# Patient Record
Sex: Male | Born: 1990 | Race: White | Hispanic: No | Marital: Married | Smoking: Never smoker
Health system: Southern US, Community
[De-identification: ages and names within clinical notes are randomized; demographics above are authoritative.]

## PROBLEM LIST (undated history)

## (undated) DIAGNOSIS — I10 Essential (primary) hypertension: Secondary | ICD-10-CM

## (undated) DIAGNOSIS — M549 Dorsalgia, unspecified: Secondary | ICD-10-CM

## (undated) DIAGNOSIS — G43909 Migraine, unspecified, not intractable, without status migrainosus: Secondary | ICD-10-CM

## (undated) HISTORY — PX: VASECTOMY: SHX75

## (undated) HISTORY — PX: NO PAST SURGERIES: SHX2092

---

## 2017-10-24 ENCOUNTER — Ambulatory Visit
Admission: EM | Admit: 2017-10-24 | Discharge: 2017-10-24 | Disposition: A | Discharge status: Home / Self Care | Payer: Managed Care, Other (non HMO) | Attending: Family Medicine | Admitting: Family Medicine

## 2017-10-24 ENCOUNTER — Other Ambulatory Visit: Payer: Self-pay

## 2017-10-24 DIAGNOSIS — A084 Viral intestinal infection, unspecified: Secondary | ICD-10-CM

## 2017-10-24 MED ORDER — ONDANSETRON 4 MG PO TBDP: 4.0000 mg | ORAL_TABLET | Freq: Three times a day (TID) | ORAL | 0 refills | Status: DC | PRN

## 2017-10-24 MED ORDER — PROMETHAZINE HCL 25 MG PO TABS: 25.0000 mg | ORAL_TABLET | Freq: Four times a day (QID) | ORAL | 0 refills | Status: DC | PRN

## 2017-10-24 MED ORDER — ONDANSETRON 8 MG PO TBDP
8.0000 mg | ORAL_TABLET | Freq: Once | ORAL | Status: AC
  Administered 2017-10-24: 8 mg via ORAL

## 2017-10-24 NOTE — ED Provider Notes (Signed)
[facility]  CSN: [id number] Arrival date & time: 10/24/17  1101  History   Chief Complaint Chief Complaint  Patient presents with  . Emesis   HPI  27 year old male presents with nausea, vomiting, diarrhea.  Started yesterday.  His wife has recently had the same illness.  He states that his wife got it from a coworker.  He reports ongoing nausea, vomiting, diarrhea.  Last episode of vomiting and diarrhea was early this morning.  He has been able to tolerate fluids and some crackers since this morning.  Feels poorly.  Associated fatigue.  No fever.  Diffuse abdominal pain.  Symptoms are moderate to severe.  No known exacerbating factors.  No other associated symptoms.  No other complaints.  History reviewed. No pertinent past medical history.  Past Surgical History:  Procedure Laterality Date  . NO PAST SURGERIES     Home Medications    Prior to Admission medications   Medication Sig Start Date End Date Taking? Authorizing Provider  ondansetron (ZOFRAN-ODT) 4 MG disintegrating tablet Take 1 tablet (4 mg total) by mouth every 8 (eight) hours as needed for nausea or vomiting. 10/24/17   Tommie Sams, DO  promethazine (PHENERGAN) 25 MG tablet Take 1 tablet (25 mg total) by mouth every 6 (six) hours as needed for nausea or vomiting. 10/24/17   Tommie Sams, DO    Family History Family History  Problem Relation Age of Onset  . Healthy Mother   . Healthy Father     Social History Social History   Tobacco Use  . Smoking status: Never Smoker  . Smokeless tobacco: Never Used  Substance Use Topics  . Alcohol use: Never    Frequency: Never  . Drug use: Never     Allergies   Patient has no known allergies.   Review of Systems Review of Systems  Constitutional: Positive for fatigue. Negative for fever.  Gastrointestinal: Positive for diarrhea, nausea and vomiting.   Physical Exam Triage Vital Signs ED Triage Vitals  Enc Vitals Group     BP 10/24/17 1125  135/71     Pulse Rate 10/24/17 1125 63     Resp 10/24/17 1125 18     Temp 10/24/17 1125 98.5 F (36.9 C)     Temp Source 10/24/17 1125 Oral     SpO2 10/24/17 1125 100 %     Weight 10/24/17 1126 210 lb (95.3 kg)     Height 10/24/17 1126 5\' 9"  (1.753 m)     Head Circumference --      Peak Flow --      Pain Score 10/24/17 1126 6     Pain Loc --      Pain Edu? --      Excl. in GC? --    Updated Vital Signs BP 135/71 (BP Location: Left Arm)   Pulse 63   Temp 98.5 F (36.9 C) (Oral)   Resp 18   Ht 5\' 9"  (1.753 m)   Wt 95.3 kg   SpO2 100%   BMI 31.01 kg/m   Visual Acuity Right Eye Distance:   Left Eye Distance:   Bilateral Distance:    Right Eye Near:   Left Eye Near:    Bilateral Near:     Physical Exam  Constitutional: He is oriented to person, place, and time. He appears well-developed. No distress.  In no acute distress but appears mildly ill & fatigued.  HENT:  Head: Normocephalic and atraumatic.  Mouth/Throat: Oropharynx is  clear and moist.  Cardiovascular: Normal rate and regular rhythm.  Pulmonary/Chest: Effort normal and breath sounds normal.  Abdominal: Soft. He exhibits no distension.  Diffusely tender to palpation.  Neurological: He is alert and oriented to person, place, and time.  Psychiatric: He has a normal mood and affect. His behavior is normal.  Nursing note and vitals reviewed.  UC Treatments / Results  Labs (all labs ordered are listed, but only abnormal results are displayed) Labs Reviewed - No data to display  EKG None  Radiology No results found.  Procedures Procedures (including critical care time)  Medications Ordered in UC Medications  ondansetron (ZOFRAN-ODT) disintegrating tablet 8 mg (8 mg Oral Given 10/24/17 1141)    Initial Impression / Assessment and Plan / UC Course  I have reviewed the triage vital signs and the nursing notes.  Pertinent labs & imaging results that were available during my care of the patient were  reviewed by me and considered in my medical decision making (see chart for details).    27 year old male presents with viral gastroenteritis.  Treating with Zofran and Phenergan (if needed).  Advised aggressive hydration.  Work note given.  Final Clinical Impressions(s) / UC Diagnoses   Final diagnoses:  Viral gastroenteritis     Discharge Instructions     Rest.  Lots of fluids.  Take care  Dr. Adriana Simasook    ED Prescriptions    Medication Sig Dispense Auth. Provider   ondansetron (ZOFRAN-ODT) 4 MG disintegrating tablet Take 1 tablet (4 mg total) by mouth every 8 (eight) hours as needed for nausea or vomiting. 20 tablet Yanai Hobson G, DO   promethazine (PHENERGAN) 25 MG tablet Take 1 tablet (25 mg total) by mouth every 6 (six) hours as needed for nausea or vomiting. 30 tablet Tommie Samsook, Brent Taillon G, DO     Controlled Substance Prescriptions [place] Controlled Substance Registry consulted? Not Applicable   Tommie SamsCook, Ebany Bowermaster G, DO 10/24/17 1206

## 2017-10-24 NOTE — Discharge Instructions (Signed)
Rest.  ° °Lots of fluids. ° °Take care ° °Dr. Jaishawn Witzke  °

## 2017-10-24 NOTE — ED Triage Notes (Signed)
Patient complains of nausea, vomiting, diarrhea x yesterday. Last Episode this am.

## 2018-05-24 ENCOUNTER — Emergency Department
Admission: EM | Admit: 2018-05-24 | Discharge: 2018-05-25 | Disposition: A | Discharge status: Home / Self Care | Payer: Managed Care, Other (non HMO) | Attending: Emergency Medicine | Admitting: Emergency Medicine

## 2018-05-24 ENCOUNTER — Encounter: Payer: Self-pay | Admitting: *Deleted

## 2018-05-24 ENCOUNTER — Emergency Department: Payer: Managed Care, Other (non HMO)

## 2018-05-24 ENCOUNTER — Other Ambulatory Visit: Payer: Self-pay

## 2018-05-24 DIAGNOSIS — R070 Pain in throat: Secondary | ICD-10-CM | POA: Diagnosis present

## 2018-05-24 DIAGNOSIS — R509 Fever, unspecified: Secondary | ICD-10-CM

## 2018-05-24 DIAGNOSIS — Z20828 Contact with and (suspected) exposure to other viral communicable diseases: Secondary | ICD-10-CM | POA: Insufficient documentation

## 2018-05-24 DIAGNOSIS — J069 Acute upper respiratory infection, unspecified: Secondary | ICD-10-CM | POA: Insufficient documentation

## 2018-05-24 DIAGNOSIS — R6889 Other general symptoms and signs: Secondary | ICD-10-CM

## 2018-05-24 DIAGNOSIS — Z20822 Contact with and (suspected) exposure to covid-19: Secondary | ICD-10-CM

## 2018-05-24 HISTORY — DX: Dorsalgia, unspecified: M54.9

## 2018-05-24 LAB — CBC WITH DIFFERENTIAL/PLATELET
Abs Immature Granulocytes: 0.04 10*3/uL (ref 0.00–0.07)
BASOS ABS: 0.1 10*3/uL (ref 0.0–0.1)
BASOS PCT: 0 %
EOS ABS: 0 10*3/uL (ref 0.0–0.5)
EOS PCT: 0 %
HCT: 42.1 % (ref 39.0–52.0)
Hemoglobin: 14.4 g/dL (ref 13.0–17.0)
Immature Granulocytes: 0 %
Lymphocytes Relative: 11 %
Lymphs Abs: 1.6 10*3/uL (ref 0.7–4.0)
MCH: 29 pg (ref 26.0–34.0)
MCHC: 34.2 g/dL (ref 30.0–36.0)
MCV: 84.9 fL (ref 80.0–100.0)
Monocytes Absolute: 1 10*3/uL (ref 0.1–1.0)
Monocytes Relative: 7 %
NRBC: 0 % (ref 0.0–0.2)
Neutro Abs: 11.5 10*3/uL — ABNORMAL HIGH (ref 1.7–7.7)
Neutrophils Relative %: 82 %
PLATELETS: 279 10*3/uL (ref 150–400)
RBC: 4.96 MIL/uL (ref 4.22–5.81)
RDW: 12.6 % (ref 11.5–15.5)
WBC: 14.2 10*3/uL — AB (ref 4.0–10.5)

## 2018-05-24 LAB — GROUP A STREP BY PCR: Group A Strep by PCR: NOT DETECTED

## 2018-05-24 LAB — INFLUENZA PANEL BY PCR (TYPE A & B)
INFLBPCR: NEGATIVE
Influenza A By PCR: NEGATIVE

## 2018-05-24 MED ORDER — SODIUM CHLORIDE 0.9 % IV BOLUS
1000.0000 mL | Freq: Once | INTRAVENOUS | Status: AC
  Administered 2018-05-24: 1000 mL via INTRAVENOUS

## 2018-05-24 MED ORDER — KETOROLAC TROMETHAMINE 30 MG/ML IJ SOLN
30.0000 mg | Freq: Once | INTRAMUSCULAR | Status: AC
  Administered 2018-05-24: 30 mg via INTRAVENOUS
  Filled 2018-05-24: qty 1

## 2018-05-24 MED ORDER — ACETAMINOPHEN 500 MG PO TABS
1000.0000 mg | ORAL_TABLET | ORAL | Status: AC
  Administered 2018-05-24: 1000 mg via ORAL
  Filled 2018-05-24: qty 2

## 2018-05-24 NOTE — ED Triage Notes (Signed)
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 Patient has a fever, c/o body aches and a sore throat which started yesterday morning. Patient has not received the flu vaccine.

## 2018-05-24 NOTE — ED Notes (Signed)
Report given to Noel RN 

## 2018-05-25 LAB — BASIC METABOLIC PANEL
Anion gap: 10 (ref 5–15)
BUN: 19 mg/dL (ref 6–20)
CALCIUM: 9.2 mg/dL (ref 8.9–10.3)
CO2: 21 mmol/L — ABNORMAL LOW (ref 22–32)
CREATININE: 1.17 mg/dL (ref 0.61–1.24)
Chloride: 108 mmol/L (ref 98–111)
GFR calc non Af Amer: >60 mL/min (ref >60)
Glucose, Bld: 111 mg/dL — ABNORMAL HIGH (ref 70–99)
Potassium: 3.7 mmol/L (ref 3.5–5.1)
SODIUM: 139 mmol/L (ref 135–145)

## 2018-05-25 LAB — MONONUCLEOSIS SCREEN: MONO SCREEN: NEGATIVE

## 2018-05-25 MED ORDER — SODIUM CHLORIDE 0.9 % IV BOLUS
1000.0000 mL | Freq: Once | INTRAVENOUS | Status: AC
  Administered 2018-05-25: 1000 mL via INTRAVENOUS

## 2018-05-25 NOTE — ED Notes (Signed)
Peripheral IV discontinued. Catheter intact. No signs of infiltration or redness. Gauze applied to IV site.   Discharge instructions reviewed with patient. Questions fielded by this RN. Patient verbalizes understanding of instructions. Patient discharged home in stable condition per sung. No acute distress noted at time of discharge.    

## 2018-05-25 NOTE — ED Provider Notes (Signed)
[facility] Emergency Department Provider Note   ____________________________________________   First MD Initiated Contact with Patient 05/24/18 2231     (approximate)  I have reviewed the triage vital signs and the nursing notes.   HISTORY  Chief Complaint Fever    HPI Devin Carpenter is a 28 y.o. male reports no major medical history other than chronic back pain  Patient reports he started feeling yesterday a sore throat and body aches.  His symptoms have worsened over the course last 24 hours.  He reports that dry cough, body aches, fever.  Slightly sore throat.  No nausea vomiting or diarrhea.  Reports started having fevers and significant chills.  No recent hospitalizations or history of infections.  He works at the [employer] where there are many cases of coronavirus now in [facility].  He also reports multiple coworkers of his went to a concert where coronavirus was suspected to be spread   Past Medical History:  Diagnosis Date  . Back pain     There are no active problems to display for this patient.   Past Surgical History:  Procedure Laterality Date  . NO PAST SURGERIES      Prior to Admission medications   Medication Sig Start Date End Date Taking? Authorizing Provider  ondansetron (ZOFRAN-ODT) 4 MG disintegrating tablet Take 1 tablet (4 mg total) by mouth every 8 (eight) hours as needed for nausea or vomiting. 10/24/17   Tommie Sams, DO  promethazine (PHENERGAN) 25 MG tablet Take 1 tablet (25 mg total) by mouth every 6 (six) hours as needed for nausea or vomiting. 10/24/17   Tommie Sams, DO    Allergies Patient has no known allergies.  Family History  Problem Relation Age of Onset  . Healthy Mother   . Healthy Father     Social History Social History   Tobacco Use  . Smoking status: Never Smoker  . Smokeless tobacco: Never Used  Substance Use Topics  . Alcohol use: Never    Frequency: Never  . Drug use:  Never    Review of Systems Constitutional: Fevers chills and some fatigue Eyes: No visual changes. ENT: Moderate sore throat.  No trouble swallowing.  Uvula midline.  No masses. Cardiovascular: Denies chest pain. Respiratory: Denies shortness of breath.  Positive for productive cough. Gastrointestinal: No abdominal pain.   Genitourinary: Negative for dysuria. Musculoskeletal: Negative for back pain except for his chronic pain, no worsening.  Muscle aches all over. Skin: Negative for rash. Neurological: Negative for headaches, areas of focal weakness or numbness.    ____________________________________________   PHYSICAL EXAM:  VITAL SIGNS: ED Triage Vitals  Enc Vitals Group     BP 05/24/18 2210 139/81     Pulse Rate 05/24/18 2210 99     Resp 05/24/18 2210 (!) 22     Temp 05/24/18 2210 (!) 100.5 F (38.1 C)     Temp Source 05/24/18 2210 Oral     SpO2 05/24/18 2210 100 %     Weight 05/24/18 2216 215 lb (97.5 kg)     Height 05/24/18 2216 5\' 9"  (1.753 m)     Head Circumference --      Peak Flow --      Pain Score 05/24/18 2215 9     Pain Loc --      Pain Edu? --      Excl. in GC? --     Constitutional: Alert and oriented.  Mildly ill-appearing.  Very pleasant.  No acute distress Eyes: Conjunctivae are normal. Head: Atraumatic. Nose: No congestion/rhinnorhea. Mouth/Throat: Mucous membranes are moist. Neck: No stridor.  Posterior oropharynx is slightly injected.  Tonsils are slightly hypertrophied without exudate bilateral.  No cervical adenopathy. Cardiovascular: This minimally tachycardic rate, regular rhythm. Grossly normal heart sounds.  Good peripheral circulation. Respiratory: Normal respiratory effort.  No retractions. Lungs CTAB. Gastrointestinal: Soft and nontender. No distention. Musculoskeletal: No lower extremity tenderness nor edema. Neurologic:  Normal speech and language. No gross focal neurologic deficits are appreciated.  Skin:  Skin is warm, dry and  intact. No rash noted. Psychiatric: Mood and affect are normal. Speech and behavior are normal.  ____________________________________________   LABS (all labs ordered are listed, but only abnormal results are displayed)  Labs Reviewed  CBC WITH DIFFERENTIAL/PLATELET - Abnormal; Notable for the following components:      Result Value   WBC 14.2 (*)    Neutro Abs 11.5 (*)    All other components within normal limits  BASIC METABOLIC PANEL - Abnormal; Notable for the following components:   CO2 21 (*)    Glucose, Bld 111 (*)    All other components within normal limits  GROUP A STREP BY PCR  NOVEL CORONAVIRUS, NAA (HOSPITAL ORDER, SEND-OUT TO REF LAB)  INFLUENZA PANEL BY PCR (TYPE A & B)  MONONUCLEOSIS SCREEN   ____________________________________________  EKG   ____________________________________________  RADIOLOGY  Chest x-ray clear ____________________________________________   PROCEDURES  Procedure(s) performed: None  Procedures  Critical Care performed: No  ____________________________________________   INITIAL IMPRESSION / ASSESSMENT AND PLAN / ED COURSE  Pertinent labs & imaging results that were available during my care of the patient were reviewed by me and considered in my medical decision making (see chart for details).   Patient presents with constellations of findings suggestive of viral illness.  I will certainly test him and exclude influenza, obtain chest x-ray to evaluate for possible pulmonary associated disease.  Denies symptoms of abdominal pain or urinary symptoms.  No rash.  Unfortunately he has multiple close contacts that could have coronavirus though it is unclear.  Also check mono and strep.  Hydrate provide Toradol for antipyretic and myalgias.  ----------------------------------------- 12:43 AM on 05/25/2018 -----------------------------------------  Patient reports he is starting to feel improved.  He is resting comfortably with  normal oxygen saturations and work of breathing.  He has chest slight tachycardia with a heart rate of about 100.  Will give additional liter of fluid, at this point he is not having any respiratory distress, his oxygen saturations well he has normal level of alertness, young healthy without notable comorbidity.  I suspect this may be coronavirus, or likely other viral syndrome.  Doubt bacterial.  Ongoing care assigned to Dr. Wynelle Link, follow-up conversation with the patient after fluid and if feeling improved, heart rate improving anticipate discharge to home.  I did discuss coronavirus test, exclusion of coronavirus and self-monitoring with the patient and he is in agreement with that.  Completed forms for public health as well.      ____________________________________________   FINAL CLINICAL IMPRESSION(S) / ED DIAGNOSES  Final diagnoses:  Suspected Covid-19 Virus Infection  Viral upper respiratory tract infection        Note:  This document was prepared using Dragon voice recognition software and may include unintentional dictation errors       Sharyn Creamer, MD 05/25/18 0045

## 2018-05-25 NOTE — Discharge Instructions (Addendum)
Take Tylenol every 4 hours as needed for fever greater than  100.4 F.  We do not recommend you taking ibuprofen as we suspect you have coronavirus.  As we discussed, we believe your symptoms are caused by a respiratory virus.  However, because we cannot rule out the possibility of COVID-19 at this time, we recommend that you self-quarantine at home for 14 days, or until 3 consecutive days without fever (without taking medication to make your temperature come down, such as Tylenol (acetaminophen), after your respiratory symptoms have improved, and after at least 7 days have passed since your symptoms first appeared.  You should have as minimal contact as possible with anyone else including close family as per the [facility] paperwork guidelines listed below. Follow-up with your doctor by phone or online as needed and return immediately to the emergency department or call 911 only if you develop new or worsening symptoms that concern you.     Person Under Monitoring Name: Devin Carpenter  Location: [address] [place] [address]   Infection Prevention Recommendations for Individuals Confirmed to have, or Being Evaluated for, 2019 Novel Coronavirus (COVID-19) Infection Who Receive Care at Home  Individuals who are confirmed to have, or are being evaluated for, COVID-19 should follow the prevention steps below until a healthcare provider or local or state health department says they can return to normal activities.  Stay home except to get medical care You should restrict activities outside your home, except for getting medical care. Do not go to work, school, or public areas, and do not use public transportation or taxis.  Call ahead before visiting your doctor Before your medical appointment, call the healthcare provider and tell them that you have, or are being evaluated for, COVID-19 infection. This will help the healthcare providers office take steps to keep other people from getting  infected. Ask your healthcare provider to call the local or state health department.  Monitor your symptoms Seek prompt medical attention if your illness is worsening (e.g., difficulty breathing). Before going to your medical appointment, call the healthcare provider and tell them that you have, or are being evaluated for, COVID-19 infection. Ask your healthcare provider to call the local or state health department.  Wear a facemask You should wear a facemask that covers your nose and mouth when you are in the same room with other people and when you visit a healthcare provider. People who live with or visit you should also wear a facemask while they are in the same room with you.  Separate yourself from other people in your home As much as possible, you should stay in a different room from other people in your home. Also, you should use a separate bathroom, if available.  Avoid sharing household items You should not share dishes, drinking glasses, cups, eating utensils, towels, bedding, or other items with other people in your home. After using these items, you should wash them thoroughly with soap and water.  Cover your coughs and sneezes Cover your mouth and nose with a tissue when you cough or sneeze, or you can cough or sneeze into your sleeve. Throw used tissues in a lined trash can, and immediately wash your hands with soap and water for at least 20 seconds or use an alcohol-based hand rub.  Wash your [employer] your hands often and thoroughly with soap and water for at least 20 seconds. You can use an alcohol-based hand sanitizer if soap and water are not available and  if your hands are not visibly dirty. Avoid touching your eyes, nose, and mouth with unwashed hands.   Prevention Steps for Caregivers and Household Members of Individuals Confirmed to have, or Being Evaluated for, COVID-19 Infection Being Cared for in the Home  If you live with, or provide care at home for, a  person confirmed to have, or being evaluated for, COVID-19 infection please follow these guidelines to prevent infection:  Follow healthcare providers instructions Make sure that you understand and can help the patient follow any healthcare provider instructions for all care.  Provide for the patients basic needs You should help the patient with basic needs in the home and provide support for getting groceries, prescriptions, and other personal needs.  Monitor the patients symptoms If they are getting sicker, call his or her medical provider and tell them that the patient has, or is being evaluated for, COVID-19 infection. This will help the healthcare providers office take steps to keep other people from getting infected. Ask the healthcare provider to call the local or state health department.  Limit the number of people who have contact with the patient If possible, have only one caregiver for the patient. Other household members should stay in another home or place of residence. If this is not possible, they should stay in another room, or be separated from the patient as much as possible. Use a separate bathroom, if available. Restrict visitors who do not have an essential need to be in the home.  Keep older adults, very young children, and other sick people away from the patient Keep older adults, very young children, and those who have compromised immune systems or chronic health conditions away from the patient. This includes people with chronic heart, lung, or kidney conditions, diabetes, and cancer.  Ensure good ventilation Make sure that shared spaces in the home have good air flow, such as from an air conditioner or an opened window, weather permitting.  Wash your hands often Wash your hands often and thoroughly with soap and water for at least 20 seconds. You can use an alcohol based hand sanitizer if soap and water are not available and if your hands are not visibly  dirty. Avoid touching your eyes, nose, and mouth with unwashed hands. Use disposable paper towels to dry your hands. If not available, use dedicated cloth towels and replace them when they become wet.  Wear a facemask and gloves Wear a disposable facemask at all times in the room and gloves when you touch or have contact with the patients blood, body fluids, and/or secretions or excretions, such as sweat, saliva, sputum, nasal mucus, vomit, urine, or feces.  Ensure the mask fits over your nose and mouth tightly, and do not touch it during use. Throw out disposable facemasks and gloves after using them. Do not reuse. Wash your hands immediately after removing your facemask and gloves. If your personal clothing becomes contaminated, carefully remove clothing and launder. Wash your hands after handling contaminated clothing. Place all used disposable facemasks, gloves, and other waste in a lined container before disposing them with other household waste. Remove gloves and wash your hands immediately after handling these items.  Do not share dishes, glasses, or other household items with the patient Avoid sharing household items. You should not share dishes, drinking glasses, cups, eating utensils, towels, bedding, or other items with a patient who is confirmed to have, or being evaluated for, COVID-19 infection. After the person uses these items, you should wash them  thoroughly with soap and water.  Wash laundry thoroughly Immediately remove and wash clothes or bedding that have blood, body fluids, and/or secretions or excretions, such as sweat, saliva, sputum, nasal mucus, vomit, urine, or feces, on them. Wear gloves when handling laundry from the patient. Read and follow directions on labels of laundry or clothing items and detergent. In general, wash and dry with the warmest temperatures recommended on the label.  Clean all areas the individual has used often Clean all touchable surfaces, such  as counters, tabletops, doorknobs, bathroom fixtures, toilets, phones, keyboards, tablets, and bedside tables, every day. Also, clean any surfaces that may have blood, body fluids, and/or secretions or excretions on them. Wear gloves when cleaning surfaces the patient has come in contact with. Use a diluted bleach solution (e.g., dilute bleach with 1 part bleach and 10 parts water) or a household disinfectant with a label that says EPA-registered for coronaviruses. To make a bleach solution at home, add 1 tablespoon of bleach to 1 quart (4 cups) of water. For a larger supply, add  cup of bleach to 1 gallon (16 cups) of water. Read labels of cleaning products and follow recommendations provided on product labels. Labels contain instructions for safe and effective use of the cleaning product including precautions you should take when applying the product, such as wearing gloves or eye protection and making sure you have good ventilation during use of the product. Remove gloves and wash hands immediately after cleaning.  Monitor yourself for signs and symptoms of illness Caregivers and household members are considered close contacts, should monitor their health, and will be asked to limit movement outside of the home to the extent possible. Follow the monitoring steps for close contacts listed on the symptom monitoring form.   ? If you have additional questions, contact your local health department or call the epidemiologist on call at [phone] (available 24/7). ? This guidance is subject to change. For the most up-to-date guidance from [facility], please refer to their website: [email or web address]

## 2018-05-31 LAB — NOVEL CORONAVIRUS, NAA (HOSPITAL ORDER, SEND-OUT TO REF LAB): SARS-COV-2, NAA: NOT DETECTED

## 2018-06-01 ENCOUNTER — Encounter: Payer: Self-pay | Admitting: Emergency Medicine

## 2018-06-01 ENCOUNTER — Other Ambulatory Visit: Payer: Self-pay

## 2018-06-01 ENCOUNTER — Emergency Department
Admission: EM | Admit: 2018-06-01 | Discharge: 2018-06-01 | Disposition: A | Discharge status: Home / Self Care | Payer: Managed Care, Other (non HMO) | Attending: Emergency Medicine | Admitting: Emergency Medicine

## 2018-06-01 DIAGNOSIS — Z0289 Encounter for other administrative examinations: Secondary | ICD-10-CM | POA: Diagnosis present

## 2018-06-01 DIAGNOSIS — Z7689 Persons encountering health services in other specified circumstances: Secondary | ICD-10-CM

## 2018-06-01 NOTE — ED Triage Notes (Signed)
Patient reports he was recently tested for coronavirus after having cold symptoms.  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 Patient's results came back negative but patient states he is still not allowed to return to work until cleared by doctor. States he was not able to get appt with PCP.

## 2018-06-01 NOTE — ED Notes (Signed)
Patient reports "I am here for a work note" Relayed he was tested for corona and results came back negative. He now needs work note saying he is clear to work and PCP wouldn't see him to give him note

## 2018-06-01 NOTE — ED Provider Notes (Signed)
[facility] Emergency Department Provider Note  ____________________________________________   First MD Initiated Contact with Patient 06/01/18 1139     (approximate)  I have reviewed the triage vital signs and the nursing notes.   HISTORY  Chief Complaint Medical Clearance    HPI Devin Carpenter is a 28 y.o. male presents emergency department stating that he needs a note to return to work.  He states he was tested for coronavirus and was not allowed to return to work as he works in [employer].  He states he did get his results which are negative and would like to go back to work now.  He is asymptomatic.    Past Medical History:  Diagnosis Date  . Back pain     There are no active problems to display for this patient.   Past Surgical History:  Procedure Laterality Date  . NO PAST SURGERIES      Prior to Admission medications   Not on File    Allergies Patient has no known allergies.  Family History  Problem Relation Age of Onset  . Healthy Mother   . Healthy Father     Social History Social History   Tobacco Use  . Smoking status: Never Smoker  . Smokeless tobacco: Never Used  Substance Use Topics  . Alcohol use: Never    Frequency: Never  . Drug use: Never    Review of Systems  Constitutional: No fever/chills Eyes: No visual changes. ENT: No sore throat. Respiratory: Denies cough Genitourinary: Negative for dysuria. Musculoskeletal: Negative for back pain. Skin: Negative for rash.    ____________________________________________   PHYSICAL EXAM:  VITAL SIGNS: ED Triage Vitals  Enc Vitals Group     BP 06/01/18 1128 130/88     Pulse Rate 06/01/18 1128 68     Resp 06/01/18 1128 18     Temp 06/01/18 1128 98.4 F (36.9 C)     Temp Source 06/01/18 1128 Oral     SpO2 06/01/18 1128 97 %     Weight 06/01/18 1128 215 lb (97.5 kg)     Height 06/01/18 1128 5\' 9"  (1.753 m)     Head Circumference --    Peak Flow --      Pain Score 06/01/18 1131 0     Pain Loc --      Pain Edu? --      Excl. in GC? --     Constitutional: Alert and oriented. Well appearing and in no acute distress. Eyes: Conjunctivae are normal.  Head: Atraumatic. Nose: No congestion/rhinnorhea. Mouth/Throat: Mucous membranes are moist.   Neck:  supple no lymphadenopathy noted Cardiovascular: Normal rate, regular rhythm. Heart sounds are normal Respiratory: Normal respiratory effort.  No retractions, lungs c t a  GU: deferred Musculoskeletal: FROM all extremities, warm and well perfused Neurologic:  Normal speech and language.  Skin:  Skin is warm, dry and intact. No rash noted. Psychiatric: Mood and affect are normal. Speech and behavior are normal.  ____________________________________________   LABS (all labs ordered are listed, but only abnormal results are displayed)  Labs Reviewed - No data to display ____________________________________________   ____________________________________________  RADIOLOGY    ____________________________________________   PROCEDURES  Procedure(s) performed: No  Procedures    ____________________________________________   INITIAL IMPRESSION / ASSESSMENT AND PLAN / ED COURSE  Pertinent labs & imaging results that were available during my care of the patient were reviewed by me and considered in my medical decision making (see  chart for details).   Patient presents emergency department and request of a work note.  Patient appears well.  Due to his coronavirus test being negative I have no concerns as he is asymptomatic.  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     As part of my medical decision making, I reviewed the following data within the electronic MEDICAL RECORD NUMBER Nursing notes reviewed and incorporated, Old chart reviewed, Notes from prior ED visits and [place] Controlled Substance Database   ____________________________________________   FINAL CLINICAL IMPRESSION(S) / ED DIAGNOSES  Final diagnoses:  Return to work evaluation      NEW MEDICATIONS STARTED DURING THIS VISIT:  Discharge Medication List as of 06/01/2018 11:45 AM       Note:  This document was prepared using Dragon voice recognition software and may include unintentional dictation errors.    Faythe Ghee, PA-C 06/01/18 1301    Minna Antis, MD 06/02/18 2121

## 2018-06-01 NOTE — Discharge Instructions (Addendum)
Follow-up with your regular doctor if any concerns.  Due to the fact that you have improved and your coronavirus test was negative you may return to work.

## 2018-11-04 ENCOUNTER — Encounter: Payer: Self-pay | Admitting: Emergency Medicine

## 2018-11-04 ENCOUNTER — Ambulatory Visit
Admission: EM | Admit: 2018-11-04 | Discharge: 2018-11-04 | Disposition: A | Discharge status: Home / Self Care | Payer: Managed Care, Other (non HMO) | Attending: Family Medicine | Admitting: Family Medicine

## 2018-11-04 ENCOUNTER — Other Ambulatory Visit: Payer: Self-pay

## 2018-11-04 DIAGNOSIS — R112 Nausea with vomiting, unspecified: Secondary | ICD-10-CM | POA: Diagnosis not present

## 2018-11-04 MED ORDER — ONDANSETRON 4 MG PO TBDP: 4.0000 mg | ORAL_TABLET | Freq: Three times a day (TID) | ORAL | 0 refills | Status: DC | PRN

## 2018-11-04 MED ORDER — ONDANSETRON 8 MG PO TBDP
8.0000 mg | ORAL_TABLET | Freq: Once | ORAL | Status: AC
  Administered 2018-11-04: 8 mg via ORAL

## 2018-11-04 MED ORDER — PROMETHAZINE HCL 25 MG PO TABS: 25.0000 mg | ORAL_TABLET | Freq: Three times a day (TID) | ORAL | 0 refills | Status: DC | PRN

## 2018-11-04 NOTE — ED Notes (Signed)
Patient taking PO sips of ginger ale.

## 2018-11-04 NOTE — ED Provider Notes (Signed)
[facility]    CSN: [id number] Arrival date & time: 11/04/18  1242  History   Chief Complaint Chief Complaint  Patient presents with  . Emesis   HPI  28 year old male presents with nausea and vomiting.  Patient reports that his symptoms started abruptly this morning.  He reports nausea and one episode of emesis.  Continues to feel very nauseated.  No abdominal pain.  No diarrhea.  He is concerned that he may have food poisoning.  No sick contacts.  No fever.  He is tolerating fluids.  No known exacerbating or relieving factors.  No reports of hematemesis.  No other associated symptoms.  No other complaints.  PMH, Surgical Hx, Family Hx, Social History reviewed and updated as below.  Past Medical History:  Diagnosis Date  . Back pain    Past Surgical History:  Procedure Laterality Date  . NO PAST SURGERIES     Home Medications    Prior to Admission medications   Medication Sig Start Date End Date Taking? Authorizing Provider  ondansetron (ZOFRAN-ODT) 4 MG disintegrating tablet Take 1 tablet (4 mg total) by mouth every 8 (eight) hours as needed for nausea or vomiting. 11/04/18   Tommie Samsook, Taner Rzepka G, DO  promethazine (PHENERGAN) 25 MG tablet Take 1 tablet (25 mg total) by mouth every 8 (eight) hours as needed for nausea, vomiting or refractory nausea / vomiting. 11/04/18   Tommie Samsook, Harriet Bollen G, DO   Family History Family History  Problem Relation Age of Onset  . Healthy Mother   . Healthy Father    Social History Social History   Tobacco Use  . Smoking status: Never Smoker  . Smokeless tobacco: Never Used  Substance Use Topics  . Alcohol use: Never    Frequency: Never  . Drug use: Never   Allergies   Patient has no known allergies.   Review of Systems Review of Systems  Constitutional: Positive for appetite change. Negative for fever.  Gastrointestinal: Positive for nausea and vomiting. Negative for abdominal pain.   Physical Exam Triage Vital Signs ED Triage  Vitals  Enc Vitals Group     BP 11/04/18 1307 121/78     Pulse Rate 11/04/18 1307 64     Resp 11/04/18 1307 18     Temp 11/04/18 1307 98.2 F (36.8 C)     Temp Source 11/04/18 1307 Oral     SpO2 11/04/18 1307 100 %     Weight 11/04/18 1305 210 lb (95.3 kg)     Height 11/04/18 1305 5\' 9"  (1.753 m)     Head Circumference --      Peak Flow --      Pain Score 11/04/18 1304 3     Pain Loc --      Pain Edu? --      Excl. in GC? --    Updated Vital Signs BP 121/78 (BP Location: Right Arm)   Pulse 64   Temp 98.2 F (36.8 C) (Oral)   Resp 18   Ht 5\' 9"  (1.753 m)   Wt 95.3 kg   SpO2 100%   BMI 31.01 kg/m   Visual Acuity Right Eye Distance:   Left Eye Distance:   Bilateral Distance:    Right Eye Near:   Left Eye Near:    Bilateral Near:     Physical Exam Vitals signs and nursing note reviewed.  Constitutional:      General: He is not in acute distress.    Appearance:  Normal appearance.     Comments: Appears fatigued/mildly ill.  HENT:     Head: Normocephalic and atraumatic.  Eyes:     General:        Right eye: No discharge.        Left eye: No discharge.     Conjunctiva/sclera: Conjunctivae normal.  Cardiovascular:     Rate and Rhythm: Normal rate and regular rhythm.     Heart sounds: No murmur.  Pulmonary:     Effort: Pulmonary effort is normal.     Breath sounds: Normal breath sounds. No wheezing, rhonchi or rales.  Abdominal:     General: There is no distension.     Palpations: Abdomen is soft.     Tenderness: There is no abdominal tenderness.  Neurological:     Mental Status: He is alert.  Psychiatric:        Mood and Affect: Mood normal.        Behavior: Behavior normal.    UC Treatments / Results  Labs (all labs ordered are listed, but only abnormal results are displayed) Labs Reviewed - No data to display  EKG   Radiology No results found.  Procedures Procedures (including critical care time)  Medications Ordered in UC Medications   ondansetron (ZOFRAN-ODT) disintegrating tablet 8 mg (8 mg Oral Given 11/04/18 1322)    Initial Impression / Assessment and Plan / UC Course  I have reviewed the triage vital signs and the nursing notes.  Pertinent labs & imaging results that were available during my care of the patient were reviewed by me and considered in my medical decision making (see chart for details).    28 year old male presents with nausea and vomiting.  Zofran given here.  Patient tolerating fluids.  Discharging home on Zofran and Phenergan.  Work note given.  Final Clinical Impressions(s) / UC Diagnoses   Final diagnoses:  Non-intractable vomiting with nausea, unspecified vomiting type     Discharge Instructions     Rest.  Fluids.  Medication as needed.  Take care  Dr. Lacinda Axon    ED Prescriptions    Medication Sig Dispense Auth. Provider   ondansetron (ZOFRAN-ODT) 4 MG disintegrating tablet Take 1 tablet (4 mg total) by mouth every 8 (eight) hours as needed for nausea or vomiting. 20 tablet Aaronjames Kelsay G, DO   promethazine (PHENERGAN) 25 MG tablet Take 1 tablet (25 mg total) by mouth every 8 (eight) hours as needed for nausea, vomiting or refractory nausea / vomiting. 30 tablet Coral Spikes, DO     Controlled Substance Prescriptions [place] Controlled Substance Registry consulted? Not Applicable   Coral Spikes, DO 11/04/18 1702

## 2018-11-04 NOTE — ED Triage Notes (Signed)
Pt c/o vomiting. He states that he vomited this morning and had been able to keep water down but has been very nauseous. Denies diarrhea or abdominal pain. He believes he has food poisoning. No one else has been sick but no one else ate the same thing as him either.

## 2018-11-04 NOTE — Discharge Instructions (Signed)
Rest. Fluids. ° °Medication as needed. ° °Take care ° °Dr. Yunique Dearcos  °

## 2018-12-05 ENCOUNTER — Other Ambulatory Visit: Payer: Self-pay

## 2018-12-05 DIAGNOSIS — G43809 Other migraine, not intractable, without status migrainosus: Secondary | ICD-10-CM | POA: Diagnosis not present

## 2018-12-05 DIAGNOSIS — Z79899 Other long term (current) drug therapy: Secondary | ICD-10-CM | POA: Diagnosis not present

## 2018-12-05 DIAGNOSIS — R519 Headache, unspecified: Secondary | ICD-10-CM | POA: Diagnosis present

## 2018-12-05 NOTE — ED Triage Notes (Signed)
Patient reports history of migraines but has not had one in years.  Reports felt this one coming on Monday and attempted otc meds but did not help.  Patient reports he has never had one last this long.

## 2018-12-06 ENCOUNTER — Emergency Department
Admission: EM | Admit: 2018-12-06 | Discharge: 2018-12-06 | Disposition: A | Discharge status: Home / Self Care | Payer: Managed Care, Other (non HMO) | Attending: Emergency Medicine | Admitting: Emergency Medicine

## 2018-12-06 DIAGNOSIS — G43809 Other migraine, not intractable, without status migrainosus: Secondary | ICD-10-CM

## 2018-12-06 MED ORDER — SODIUM CHLORIDE 0.9 % IV BOLUS
1000.0000 mL | Freq: Once | INTRAVENOUS | Status: AC
  Administered 2018-12-06: 1000 mL via INTRAVENOUS

## 2018-12-06 MED ORDER — SUMATRIPTAN SUCCINATE 50 MG PO TABS: 50.0000 mg | ORAL_TABLET | Freq: Once | ORAL | 0 refills | Status: DC | PRN

## 2018-12-06 MED ORDER — KETOROLAC TROMETHAMINE 30 MG/ML IJ SOLN
30.0000 mg | Freq: Once | INTRAMUSCULAR | Status: AC
  Administered 2018-12-06: 30 mg via INTRAVENOUS
  Filled 2018-12-06: qty 1

## 2018-12-06 MED ORDER — METOCLOPRAMIDE HCL 5 MG/ML IJ SOLN
10.0000 mg | Freq: Once | INTRAMUSCULAR | Status: AC
  Administered 2018-12-06: 02:00:00 10 mg via INTRAVENOUS
  Filled 2018-12-06: qty 2

## 2018-12-06 MED ORDER — SUMATRIPTAN SUCCINATE 6 MG/0.5ML ~~LOC~~ SOLN
6.0000 mg | Freq: Once | SUBCUTANEOUS | Status: AC
  Administered 2018-12-06: 02:00:00 6 mg via SUBCUTANEOUS
  Filled 2018-12-06: qty 0.5

## 2018-12-06 NOTE — ED Provider Notes (Signed)
[facility] Emergency Department Provider Note    First MD Initiated Contact with Patient 12/06/18 [phone]     (approximate)  I have reviewed the triage vital signs and the nursing notes.   HISTORY  Chief Complaint Migraine    HPI Devin Carpenter is a 28 y.o. male with history of migraine headaches presents to the emergency department with headache that the patient states has been occurring since Monday.  Patient admits to photosensitivity and phonophobia.  Patient denies any weakness no numbness gait instability or visual changes.  Patient denies any fever.  Patient states that he has had previous imaging of his brain performed which was normal.        Past Medical History:  Diagnosis Date  . Back pain     There are no active problems to display for this patient.   Past Surgical History:  Procedure Laterality Date  . NO PAST SURGERIES      Prior to Admission medications   Medication Sig Start Date End Date Taking? Authorizing Provider  ondansetron (ZOFRAN-ODT) 4 MG disintegrating tablet Take 1 tablet (4 mg total) by mouth every 8 (eight) hours as needed for nausea or vomiting. 11/04/18   Tommie Sams, DO  promethazine (PHENERGAN) 25 MG tablet Take 1 tablet (25 mg total) by mouth every 8 (eight) hours as needed for nausea, vomiting or refractory nausea / vomiting. 11/04/18   Tommie Sams, DO    Allergies Patient has no known allergies.  Family History  Problem Relation Age of Onset  . Healthy Mother   . Healthy Father     Social History Social History   Tobacco Use  . Smoking status: Never Smoker  . Smokeless tobacco: Never Used  Substance Use Topics  . Alcohol use: Never    Frequency: Never  . Drug use: Never    Review of Systems Constitutional: No fever/chills Eyes: No visual changes. ENT: No sore throat. Cardiovascular: Denies chest pain. Respiratory: Denies shortness of breath. Gastrointestinal: No abdominal pain.  No nausea,  no vomiting.  No diarrhea.  No constipation. Genitourinary: Negative for dysuria. Musculoskeletal: Negative for neck pain.  Negative for back pain. Integumentary: Negative for rash. Neurological: Positive for headaches, negative for focal weakness or numbness.   ____________________________________________   PHYSICAL EXAM:  VITAL SIGNS: ED Triage Vitals  Enc Vitals Group     BP 12/05/18 2151 (!) 173/99     Pulse Rate 12/05/18 2151 77     Resp 12/05/18 2151 18     Temp 12/05/18 2151 98.4 F (36.9 C)     Temp Source 12/05/18 2151 Oral     SpO2 12/05/18 2151 98 %     Weight 12/05/18 2153 98.4 kg (217 lb)     Height 12/05/18 2153 1.753 m (5\' 9" )     Head Circumference --      Peak Flow --      Pain Score 12/05/18 2153 8     Pain Loc --      Pain Edu? --      Excl. in GC? --     Constitutional: Alert and oriented.  Eyes: Conjunctivae are normal.  Head: Atraumatic. Mouth/Throat: Mucous membranes are moist. Neck: No stridor.  No meningeal signs.   Cardiovascular: Normal rate, regular rhythm. Good peripheral circulation. Grossly normal heart sounds. Respiratory: Normal respiratory effort.  No retractions. Gastrointestinal: Soft and nontender. No distention.  Musculoskeletal: No lower extremity tenderness nor edema. No gross deformities of extremities. Neurologic:  Normal speech and language. No gross focal neurologic deficits are appreciated.  Skin:  Skin is warm, dry and intact. Psychiatric: Mood and affect are normal. Speech and behavior are normal.  _________________   :  Procedures   ____________________________________________   INITIAL IMPRESSION / MDM / [place] / ED COURSE  As part of my medical decision making, I reviewed the following data within the electronic MEDICAL RECORD NUMBER  28 year old male presented with above-stated history and physical exam and headache.  Patient given IV Toradol 30 mg Reglan 10 mg and 6 mg of subcu Imitrex with  resolution of headache.  Patient be prescribed Imitrex for home.  ____________________________________________  FINAL CLINICAL IMPRESSION(S) / ED DIAGNOSES  Final diagnoses:  Other migraine without status migrainosus, not intractable     MEDICATIONS GIVEN DURING THIS VISIT:  Medications  ketorolac (TORADOL) 30 MG/ML injection 30 mg (has no administration in time range)  metoCLOPramide (REGLAN) injection 10 mg (has no administration in time range)  SUMAtriptan (IMITREX) injection 6 mg (has no administration in time range)  sodium chloride 0.9 % bolus 1,000 mL (has no administration in time range)     ED Discharge Orders    None      *Please note:  Devin Carpenter was evaluated in Emergency Department on 12/06/2018 for the symptoms described in the history of present illness. He was evaluated in the context of the global COVID-19 pandemic, which necessitated consideration that the patient might be at risk for infection with the SARS-CoV-2 virus that causes COVID-19. Institutional protocols and algorithms that pertain to the evaluation of patients at risk for COVID-19 are in a state of rapid change based on information released by regulatory bodies including the CDC and federal and state organizations. These policies and algorithms were followed during the patient's care in the ED.  Some ED evaluations and interventions may be delayed as a result of limited staffing during the pandemic.*  Note:  This document was prepared using Dragon voice recognition software and may include unintentional dictation errors.   Gregor Hams, MD 12/06/18 [phone]

## 2018-12-06 NOTE — ED Notes (Signed)
Peripheral IV discontinued. Catheter intact. No signs of infiltration or redness. Gauze applied to IV site.   Discharge instructions reviewed with patient. Questions fielded by this RN. Patient verbalizes understanding of instructions. Patient discharged home in stable condition per brown. No acute distress noted at time of discharge.    

## 2019-05-05 ENCOUNTER — Other Ambulatory Visit: Payer: Self-pay

## 2019-05-05 ENCOUNTER — Encounter: Payer: Self-pay | Admitting: Emergency Medicine

## 2019-05-05 ENCOUNTER — Ambulatory Visit
Admission: EM | Admit: 2019-05-05 | Discharge: 2019-05-05 | Disposition: A | Discharge status: Home / Self Care | Payer: Managed Care, Other (non HMO) | Attending: Family Medicine | Admitting: Family Medicine

## 2019-05-05 DIAGNOSIS — M62838 Other muscle spasm: Secondary | ICD-10-CM | POA: Diagnosis not present

## 2019-05-05 DIAGNOSIS — X500XXA Overexertion from strenuous movement or load, initial encounter: Secondary | ICD-10-CM

## 2019-05-05 DIAGNOSIS — S39012A Strain of muscle, fascia and tendon of lower back, initial encounter: Secondary | ICD-10-CM

## 2019-05-05 HISTORY — DX: Migraine, unspecified, not intractable, without status migrainosus: G43.909

## 2019-05-05 MED ORDER — KETOROLAC TROMETHAMINE 10 MG PO TABS: 10.0000 mg | ORAL_TABLET | Freq: Four times a day (QID) | ORAL | 0 refills | Status: DC | PRN

## 2019-05-05 MED ORDER — TIZANIDINE HCL 4 MG PO TABS: 4.0000 mg | ORAL_TABLET | Freq: Four times a day (QID) | ORAL | 0 refills | Status: DC | PRN

## 2019-05-05 NOTE — Discharge Instructions (Signed)
Rest.  Heat.  Medication as prescribed.  Take care  Dr. Nimah Uphoff  

## 2019-05-05 NOTE — ED Triage Notes (Signed)
Patient in today c/o low back pain after moving furniture yesterday. Patient has taken OTC Tylenol and Ibuprofen. Patient's last dose of Ibuprofen was ~10:20 am.

## 2019-05-05 NOTE — ED Provider Notes (Signed)
[facility]    CSN: [id number] Arrival date & time: 05/05/19  1156  History   Chief Complaint Chief Complaint  Patient presents with  . Back Injury    DOI 05/04/19   HPI  29 year old male presents with the above complaint.  Patient states that he was moving a [employer] yesterday.  He states that he believes that he has strained his left low back.  He reports severe pain, decreased range of motion.  Rates his pain as 7/10 in severity.  He has taken ibuprofen and Tylenol without relief.  Exacerbated by movements.  No relieving factors.  No radicular symptoms.  No other complaints.  Past Medical History:  Diagnosis Date  . Back pain   . Migraine    Past Surgical History:  Procedure Laterality Date  . VASECTOMY     Home Medications    Prior to Admission medications   Medication Sig Start Date End Date Taking? Authorizing Provider  SUMAtriptan (IMITREX) 50 MG tablet Take 1 tablet (50 mg total) by mouth once as needed for migraine. May repeat in 2 hours if headache persists or recurs. 12/06/18 12/07/19 Yes Gregor Hams, MD  ketorolac (TORADOL) 10 MG tablet Take 1 tablet (10 mg total) by mouth every 6 (six) hours as needed for moderate pain or severe pain. 05/05/19   Coral Spikes, DO  tiZANidine (ZANAFLEX) 4 MG tablet Take 1 tablet (4 mg total) by mouth every 6 (six) hours as needed for muscle spasms. 05/05/19   Coral Spikes, DO  promethazine (PHENERGAN) 25 MG tablet Take 1 tablet (25 mg total) by mouth every 8 (eight) hours as needed for nausea, vomiting or refractory nausea / vomiting. 11/04/18 05/05/19  Coral Spikes, DO    Family History Family History  Problem Relation Age of Onset  . Healthy Mother   . Healthy Father     Social History Social History   Tobacco Use  . Smoking status: Never Smoker  . Smokeless tobacco: Never Used  Substance Use Topics  . Alcohol use: Never  . Drug use: Never     Allergies   Patient has no known allergies.   Review of  Systems Review of Systems  Constitutional: Negative.   Musculoskeletal: Positive for back pain.   Physical Exam Triage Vital Signs ED Triage Vitals  Enc Vitals Group     BP 05/05/19 1239 (!) 141/73     Pulse Rate 05/05/19 1239 80     Resp 05/05/19 1239 18     Temp 05/05/19 1239 98.5 F (36.9 C)     Temp Source 05/05/19 1239 Oral     SpO2 05/05/19 1239 100 %     Weight 05/05/19 1239 203 lb (92.1 kg)     Height 05/05/19 1239 5\' 9"  (1.753 m)     Head Circumference --      Peak Flow --      Pain Score 05/05/19 1238 7     Pain Loc --      Pain Edu? --      Excl. in [place]? --    Updated Vital Signs BP (!) 141/73 (BP Location: Left Arm)   Pulse 80   Temp 98.5 F (36.9 C) (Oral)   Resp 18   Ht 5\' 9"  (1.753 m)   Wt 92.1 kg   SpO2 100%   BMI 29.98 kg/m   Visual Acuity Right Eye Distance:   Left Eye Distance:   Bilateral Distance:    Right  Eye Near:   Left Eye Near:    Bilateral Near:     Physical Exam Vitals and nursing note reviewed.  Constitutional:      General: He is not in acute distress.    Appearance: Normal appearance. He is not ill-appearing.  HENT:     Head: Normocephalic and atraumatic.  Eyes:     General:        Right eye: No discharge.        Left eye: No discharge.     Conjunctiva/sclera: Conjunctivae normal.  Cardiovascular:     Rate and Rhythm: Normal rate and regular rhythm.     Heart sounds: No murmur.  Pulmonary:     Effort: Pulmonary effort is normal.     Breath sounds: Normal breath sounds. No wheezing, rhonchi or rales.  Musculoskeletal:     Comments: Lumbar spine -left paraspinal region with spasm and tenderness to palpation.  Neurological:     Mental Status: He is alert.  Psychiatric:        Mood and Affect: Mood normal.        Behavior: Behavior normal.    UC Treatments / Results  Labs (all labs ordered are listed, but only abnormal results are displayed) Labs Reviewed - No data to display  EKG   Radiology No results found.   Procedures Procedures (including critical care time)  Medications Ordered in UC Medications - No data to display  Initial Impression / Assessment and Plan / UC Course  I have reviewed the triage vital signs and the nursing notes.  Pertinent labs & imaging results that were available during my care of the patient were reviewed by me and considered in my medical decision making (see chart for details).    29 year old male presents with lumbar muscle strain and spasm.  Toradol and Zanaflex as directed.  Work note given.  Final Clinical Impressions(s) / UC Diagnoses   Final diagnoses:  Strain of lumbar region, initial encounter  Muscle spasm     Discharge Instructions     Rest.  Heat.  Medication as prescribed.  Take care  Dr. Adriana Simas    ED Prescriptions    Medication Sig Dispense Auth. Provider   ketorolac (TORADOL) 10 MG tablet Take 1 tablet (10 mg total) by mouth every 6 (six) hours as needed for moderate pain or severe pain. 20 tablet Coner Gibbard G, DO   tiZANidine (ZANAFLEX) 4 MG tablet Take 1 tablet (4 mg total) by mouth every 6 (six) hours as needed for muscle spasms. 30 tablet Tommie Sams, DO     PDMP not reviewed this encounter.   Tommie Sams, [place] 05/05/19 1337

## 2019-05-09 ENCOUNTER — Emergency Department
Admission: EM | Admit: 2019-05-09 | Discharge: 2019-05-09 | Disposition: A | Discharge status: Home / Self Care | Payer: Managed Care, Other (non HMO) | Attending: Emergency Medicine | Admitting: Emergency Medicine

## 2019-05-09 ENCOUNTER — Other Ambulatory Visit: Payer: Self-pay

## 2019-05-09 ENCOUNTER — Emergency Department: Payer: Managed Care, Other (non HMO)

## 2019-05-09 DIAGNOSIS — Y929 Unspecified place or not applicable: Secondary | ICD-10-CM | POA: Insufficient documentation

## 2019-05-09 DIAGNOSIS — Y999 Unspecified external cause status: Secondary | ICD-10-CM | POA: Diagnosis not present

## 2019-05-09 DIAGNOSIS — S299XXA Unspecified injury of thorax, initial encounter: Secondary | ICD-10-CM | POA: Diagnosis present

## 2019-05-09 DIAGNOSIS — Y9389 Activity, other specified: Secondary | ICD-10-CM | POA: Insufficient documentation

## 2019-05-09 DIAGNOSIS — X500XXA Overexertion from strenuous movement or load, initial encounter: Secondary | ICD-10-CM | POA: Insufficient documentation

## 2019-05-09 DIAGNOSIS — M5442 Lumbago with sciatica, left side: Secondary | ICD-10-CM | POA: Insufficient documentation

## 2019-05-09 DIAGNOSIS — S22080A Wedge compression fracture of T11-T12 vertebra, initial encounter for closed fracture: Secondary | ICD-10-CM

## 2019-05-09 MED ORDER — HYDROCODONE-ACETAMINOPHEN 5-325 MG PO TABS: 1.0000 | ORAL_TABLET | Freq: Four times a day (QID) | ORAL | 0 refills | Status: DC | PRN

## 2019-05-09 MED ORDER — KETOROLAC TROMETHAMINE 30 MG/ML IJ SOLN
30.0000 mg | Freq: Once | INTRAMUSCULAR | Status: AC
  Administered 2019-05-09: 14:00:00 30 mg via INTRAMUSCULAR
  Filled 2019-05-09: qty 1

## 2019-05-09 MED ORDER — PREDNISONE 10 MG PO TABS: ORAL_TABLET | ORAL | 0 refills | Status: DC

## 2019-05-09 NOTE — ED Notes (Addendum)
Pt wheeled to lobby for discharge.Devin Carpenter

## 2019-05-09 NOTE — ED Triage Notes (Signed)
Pt with lover back pain from moving furniture this past Monday, pt used pain meds from Urgent Care visit but states that is not helping much. Pt with pain radiating to left leg. Pt denies bruising, mild swelling to left lower back.
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
## 2019-05-09 NOTE — Discharge Instructions (Signed)
Follow-up with your primary care provider or if you do not have a primary care provider you can follow-up with Dr. Deeann Saint who is on-call for orthopedics if you continue to have any continued problems with your back.  At this time it is unsure whether this is an old compression fracture or no.  Continue taking the Zanaflex which was muscle relaxant that was prescribed for you.  The Norco can only be taken when you are at home as it could cause drowsiness and increase your risk for injury.  This is a narcotic pain medication.  The prednisone you may begin today taking 6 tablets starting today and tapering down by 1 tablet each day.  You may also use ice or heat to your back as needed for discomfort.  Return to the emergency department if any severe worsening of your symptoms or if there is any incontinence of bowel or bladder.

## 2019-09-09 ENCOUNTER — Ambulatory Visit: Payer: Self-pay

## 2019-09-09 ENCOUNTER — Other Ambulatory Visit: Payer: Self-pay

## 2019-09-09 DIAGNOSIS — Z021 Encounter for pre-employment examination: Secondary | ICD-10-CM

## 2019-09-09 LAB — POCT URINE DRUG SCREEN
POC Amphetamine UR: NOT DETECTED
POC Cocaine UR: NOT DETECTED
POC Methamphetamine UR: NOT DETECTED
POC Opiate Ur: NOT DETECTED
POC PHENCYCLIDINE UR: NOT DETECTED
URINE TEMPERATURE: 92 Degrees F (ref 90.0–100.0)

## 2020-08-29 NOTE — Progress Notes (Signed)
BP 115/73   Pulse 69   Temp 97.7 F (36.5 C) (Oral)   Ht 5' 8.7" (1.745 m)   Wt 211 lb 12.8 oz (96.1 kg)   SpO2 98%   BMI 31.55 kg/m    Subjective:    Patient ID: Devin Carpenter, male    DOB: 1990/09/17, 30 y.o.   MRN: [id number]  HPI: Devin Carpenter is a 30 y.o. male  Chief Complaint  Patient presents with   Establish Care   Hypertension    Pt states he recently went to UC for HTN, put on HCTZ   Anxiety    Pt states he has been having a lot of anxiety lately, PHQ and GAD 7 done   Patient presents to clinic to establish care with new PCP.  Patient reports a history of Hypertension, Anxiety.  Patient denies a history of:  Elevated Cholesterol, Thyroid problems, Diabetes, Depression, Neurological problems, and Abdominal problems.   HYPERTENSION Hypertension status: controlled  Satisfied with current treatment? no Duration of hypertension: months BP monitoring frequency:  daily BP range: 150/80 BP medication side effects:  yes Medication compliance: excellent compliance Previous BP meds:HCTZ Aspirin: no Recurrent headaches: yes Visual changes:  when he does have headaches Palpitations: no Dyspnea: yes Chest pain: yes Lower extremity edema: no Dizzy/lightheaded: yes  DEPRESSION/ANXIETY Patient states his anxiety is worse than his depression.  States that he had some depression when he was younger but does not feel like it is a concern now.  Open to starting medication. Denies SI.   Flowsheet Row Office Visit from 08/30/2020 in [facility]  PHQ-9 Total Score 13       GAD 7 : Generalized Anxiety Score 08/30/2020  Nervous, Anxious, on Edge 3  Control/stop worrying 2  Worry too much - different things 2  Trouble relaxing 3  Restless 2  Easily annoyed or irritable 3  Afraid - awful might happen 0  Total GAD 7 Score 15  Anxiety Difficulty Very difficult     Relevant past medical, surgical, family and social history reviewed and updated as  indicated. Interim medical history since our last visit reviewed. Allergies and medications reviewed and updated.  Review of Systems  Eyes:  Negative for visual disturbance.  Respiratory:  Positive for shortness of breath.   Cardiovascular:  Positive for chest pain. Negative for leg swelling.  Neurological:  Negative for light-headedness and headaches.  Psychiatric/Behavioral:  Positive for dysphoric mood. Negative for suicidal ideas. The patient is nervous/anxious.    Per HPI unless specifically indicated above     Objective:    BP 115/73   Pulse 69   Temp 97.7 F (36.5 C) (Oral)   Ht 5' 8.7" (1.745 m)   Wt 211 lb 12.8 oz (96.1 kg)   SpO2 98%   BMI 31.55 kg/m   Wt Readings from Last 3 Encounters:  08/30/20 211 lb 12.8 oz (96.1 kg)  05/09/19 205 lb (93 kg)  05/05/19 203 lb (92.1 kg)    Physical Exam Vitals and nursing note reviewed.  Constitutional:      General: He is not in acute distress.    Appearance: Normal appearance. He is not ill-appearing, toxic-appearing or diaphoretic.  HENT:     Head: Normocephalic.     Right Ear: External ear normal.     Left Ear: External ear normal.     Nose: Nose normal. No congestion or rhinorrhea.     Mouth/Throat:     Mouth: Mucous membranes are moist.  Eyes:     General:        Right eye: No discharge.        Left eye: No discharge.     Extraocular Movements: Extraocular movements intact.     Conjunctiva/sclera: Conjunctivae normal.     Pupils: Pupils are equal, round, and reactive to light.  Cardiovascular:     Rate and Rhythm: Normal rate and regular rhythm.     Heart sounds: No murmur heard. Pulmonary:     Effort: Pulmonary effort is normal. No respiratory distress.     Breath sounds: Normal breath sounds. No wheezing, rhonchi or rales.  Abdominal:     General: Abdomen is flat. Bowel sounds are normal.  Musculoskeletal:     Cervical back: Normal range of motion and neck supple.  Skin:    General: Skin is warm and dry.      Capillary Refill: Capillary refill takes less than 2 seconds.  Neurological:     General: No focal deficit present.     Mental Status: He is alert and oriented to person, place, and time.  Psychiatric:        Mood and Affect: Mood normal.        Behavior: Behavior normal.        Thought Content: Thought content normal.        Judgment: Judgment normal.    Results for orders placed or performed in visit on 09/09/19  POCT Urine Drug Screen (CPT 80101)  Result Value Ref Range   POC Methamphetamine UR None Detected None Detected   POC Opiate Ur None Detected None Detected   POC Amphetamine UR None Detected None Detected   POC Cocaine UR None Detected None Detected   POC PHENCYCLIDINE UR None Detected None Detected   URINE TEMPERATURE 92.0 90.0 - 100.0 Degrees F      Assessment & Plan:   Problem List Items Addressed This Visit       Cardiovascular and Mediastinum   Primary hypertension    Chronic.  Medication changed to Lisinopril 10mg  daily instead of HCTZ daily.  Patient believes this could be causing his headaches. Also discussed that headaches could be related to blood pressure if it is uncontrolled.  Discussed side effects and benefits of medication with patient during visit today.  EKG showed NSR.       Relevant Medications   lisinopril (ZESTRIL) 10 MG tablet     Other   Depression, recurrent (HCC) - Primary    Chronic. Uncontrolled. Begin Zoloft daily. Side effects and benefits of medication discussed during visit today. Follow up in 1 month.  Call sooner if concerns arise.        Relevant Medications   sertraline (ZOLOFT) 25 MG tablet   Anxiety    Chronic.  Uncontrolled.  Begin Zoloft daily. Side effects and benefits of medication discussed during visit today.  Discussed the chest pain and SOB could be related to anxiety. Will reevaluate in 1 month. Call sooner if concerns arise.        Relevant Medications   sertraline (ZOLOFT) 25 MG tablet   Other Visit  Diagnoses     Chest pain, unspecified type       EKG ordered in office today. Showed NSR.  Discussed with patient in office.    Relevant Orders   EKG 12-Lead (Completed)   Encounter to establish care            Follow up plan: Return in about 1 month (around  09/29/2020) for BP Check, Depression/Anxiety FU.

## 2020-08-30 ENCOUNTER — Encounter: Payer: Self-pay | Admitting: Nurse Practitioner

## 2020-08-30 ENCOUNTER — Ambulatory Visit: Payer: BC Managed Care – PPO | Admitting: Nurse Practitioner

## 2020-08-30 ENCOUNTER — Other Ambulatory Visit: Payer: Self-pay

## 2020-08-30 VITALS — BP 115/73 | HR 69 | Temp 97.7°F | Ht 68.7 in | Wt 211.8 lb

## 2020-08-30 DIAGNOSIS — F419 Anxiety disorder, unspecified: Secondary | ICD-10-CM

## 2020-08-30 DIAGNOSIS — I10 Essential (primary) hypertension: Secondary | ICD-10-CM | POA: Diagnosis not present

## 2020-08-30 DIAGNOSIS — F339 Major depressive disorder, recurrent, unspecified: Secondary | ICD-10-CM | POA: Diagnosis not present

## 2020-08-30 DIAGNOSIS — R079 Chest pain, unspecified: Secondary | ICD-10-CM

## 2020-08-30 DIAGNOSIS — Z7689 Persons encountering health services in other specified circumstances: Secondary | ICD-10-CM

## 2020-08-30 MED ORDER — LISINOPRIL 10 MG PO TABS: 10.0000 mg | ORAL_TABLET | Freq: Every day | ORAL | 0 refills | Status: DC

## 2020-08-30 MED ORDER — SERTRALINE HCL 25 MG PO TABS: 25.0000 mg | ORAL_TABLET | Freq: Every day | ORAL | 0 refills | Status: DC

## 2020-08-30 NOTE — Assessment & Plan Note (Signed)
Chronic. Uncontrolled. Begin Zoloft daily. Side effects and benefits of medication discussed during visit today. Follow up in 1 month.  Call sooner if concerns arise.

## 2020-08-30 NOTE — Progress Notes (Signed)
NSR.  Results discussed with patient during office visit.

## 2020-08-30 NOTE — Assessment & Plan Note (Signed)
Chronic.  Medication changed to Lisinopril 10mg  daily instead of HCTZ daily.  Patient believes this could be causing his headaches. Also discussed that headaches could be related to blood pressure if it is uncontrolled.  Discussed side effects and benefits of medication with patient during visit today.  EKG showed NSR.

## 2020-08-30 NOTE — Assessment & Plan Note (Signed)
Chronic.  Uncontrolled.  Begin Zoloft daily. Side effects and benefits of medication discussed during visit today.  Discussed the chest pain and SOB could be related to anxiety. Will reevaluate in 1 month. Call sooner if concerns arise.

## 2020-09-28 NOTE — Progress Notes (Signed)
BP 126/82   Pulse 67   Temp 98.5 F (36.9 C)   Ht 5' 8.7" (1.745 m)   Wt 215 lb (97.5 kg)   SpO2 98%   BMI 32.03 kg/m    Subjective:    Patient ID: Devin Carpenter, male    DOB: November 22, 1990, 30 y.o.   MRN: [id number]  HPI: Devin Carpenter is a 30 y.o. male  Chief Complaint  Patient presents with   Depression    Patient states that it makes him nauseous when he takes the medication, he has switched to taking it at night, but he gets up early for work so he states that at times he still has the nausea    Anxiety   HYPERTENSION Hypertension status: controlled  Satisfied with current treatment? no Duration of hypertension: years BP monitoring frequency:  not checking BP range:  BP medication side effects:  no Medication compliance: excellent compliance Previous BP meds:lisinopril Aspirin: no Recurrent headaches: no Visual changes: no Palpitations: no Dyspnea: no Chest pain: no Lower extremity edema: no Dizzy/lightheaded: no  DEPRESSION Patient states the Zoloft is causing him some nausea. He is eating when he is taking it.  Patient has been taking it in the afternoon then the nausea typically hits him in the middle of the night.  He does still feel it sometimes because he gets up at 230am for work. Denies SI.  Flowsheet Row Office Visit from 09/29/2020 in [facility]  PHQ-9 Total Score 4      GAD 7 : Generalized Anxiety Score 09/29/2020 08/30/2020  Nervous, Anxious, on Edge 1 3  Control/stop worrying 1 2  Worry too much - different things 1 2  Trouble relaxing 0 3  Restless 1 2  Easily annoyed or irritable 3 3  Afraid - awful might happen 0 0  Total GAD 7 Score 7 15  Anxiety Difficulty Not difficult at all Very difficult      Relevant past medical, surgical, family and social history reviewed and updated as indicated. Interim medical history since our last visit reviewed. Allergies and medications reviewed and updated.  Review of Systems  Eyes:   Negative for visual disturbance.  Respiratory:  Negative for shortness of breath.   Cardiovascular:  Negative for chest pain and leg swelling.  Gastrointestinal:  Positive for nausea.  Neurological:  Negative for light-headedness and headaches.  Psychiatric/Behavioral:  Positive for dysphoric mood. Negative for suicidal ideas. The patient is nervous/anxious.    Per HPI unless specifically indicated above     Objective:    BP 126/82   Pulse 67   Temp 98.5 F (36.9 C)   Ht 5' 8.7" (1.745 m)   Wt 215 lb (97.5 kg)   SpO2 98%   BMI 32.03 kg/m   Wt Readings from Last 3 Encounters:  09/29/20 215 lb (97.5 kg)  08/30/20 211 lb 12.8 oz (96.1 kg)  05/09/19 205 lb (93 kg)    Physical Exam Vitals and nursing note reviewed.  Constitutional:      General: He is not in acute distress.    Appearance: Normal appearance. He is not ill-appearing, toxic-appearing or diaphoretic.  HENT:     Head: Normocephalic.     Right Ear: External ear normal.     Left Ear: External ear normal.     Nose: Nose normal. No congestion or rhinorrhea.     Mouth/Throat:     Mouth: Mucous membranes are moist.  Eyes:     General:  Right eye: No discharge.        Left eye: No discharge.     Extraocular Movements: Extraocular movements intact.     Conjunctiva/sclera: Conjunctivae normal.     Pupils: Pupils are equal, round, and reactive to light.  Cardiovascular:     Rate and Rhythm: Normal rate and regular rhythm.     Heart sounds: No murmur heard. Pulmonary:     Effort: Pulmonary effort is normal. No respiratory distress.     Breath sounds: Normal breath sounds. No wheezing, rhonchi or rales.  Abdominal:     General: Abdomen is flat. Bowel sounds are normal.  Musculoskeletal:     Cervical back: Normal range of motion and neck supple.  Skin:    General: Skin is warm and dry.     Capillary Refill: Capillary refill takes less than 2 seconds.  Neurological:     General: No focal deficit present.      Mental Status: He is alert and oriented to person, place, and time.  Psychiatric:        Mood and Affect: Mood normal.        Behavior: Behavior normal.        Thought Content: Thought content normal.        Judgment: Judgment normal.    Results for orders placed or performed in visit on 09/09/19  POCT Urine Drug Screen (CPT 80101)  Result Value Ref Range   POC Methamphetamine UR None Detected None Detected   POC Opiate Ur None Detected None Detected   POC Amphetamine UR None Detected None Detected   POC Cocaine UR None Detected None Detected   POC PHENCYCLIDINE UR None Detected None Detected   URINE TEMPERATURE 92.0 90.0 - 100.0 Degrees F      Assessment & Plan:   Problem List Items Addressed This Visit       Cardiovascular and Mediastinum   Primary hypertension - Primary    Chronic.  Controlled.  Continue with current medication regimen. Refill sent today. Return to clinic in 2 months for reevaluation.  Call sooner if concerns arise.         Relevant Medications   lisinopril (ZESTRIL) 10 MG tablet     Other   Depression, recurrent (HCC)    Chronic. Improved from prior. Feels like depression is much better. Does feel like he will benefit from increasing the Zoloft dose.  Will increase Zoloft to 50mg  daily. He does not want to switch medications at this time even though he is experiencing nausea. Will follow up in 2 months for reevaluation.        Relevant Medications   sertraline (ZOLOFT) 50 MG tablet   Anxiety    Chronic. Improved from prior. Feels like depression is much better but feels like his anxiety could be better.  Does feel like he will benefit from increasing the Zoloft dose.  Will increase Zoloft to 50mg  daily. He does not want to switch medications at this time even though he is experiencing nausea. Will follow up in 2 months for reevaluation.        Relevant Medications   sertraline (ZOLOFT) 50 MG tablet     Follow up plan: Return in about 2 months  (around 11/30/2020) for Depression/Anxiety FU.

## 2020-09-29 ENCOUNTER — Other Ambulatory Visit: Payer: Self-pay

## 2020-09-29 ENCOUNTER — Encounter: Payer: Self-pay | Admitting: Nurse Practitioner

## 2020-09-29 ENCOUNTER — Ambulatory Visit: Payer: BC Managed Care – PPO | Admitting: Nurse Practitioner

## 2020-09-29 VITALS — BP 126/82 | HR 67 | Temp 98.5°F | Ht 68.7 in | Wt 215.0 lb

## 2020-09-29 DIAGNOSIS — F339 Major depressive disorder, recurrent, unspecified: Secondary | ICD-10-CM | POA: Diagnosis not present

## 2020-09-29 DIAGNOSIS — F419 Anxiety disorder, unspecified: Secondary | ICD-10-CM | POA: Diagnosis not present

## 2020-09-29 DIAGNOSIS — I10 Essential (primary) hypertension: Secondary | ICD-10-CM | POA: Diagnosis not present

## 2020-09-29 MED ORDER — LISINOPRIL 10 MG PO TABS: 10.0000 mg | ORAL_TABLET | Freq: Every day | ORAL | 1 refills | Status: DC

## 2020-09-29 MED ORDER — SERTRALINE HCL 50 MG PO TABS: 50.0000 mg | ORAL_TABLET | Freq: Every day | ORAL | 0 refills | Status: DC

## 2020-09-29 NOTE — Assessment & Plan Note (Signed)
Chronic. Improved from prior. Feels like depression is much better. Does feel like he will benefit from increasing the Zoloft dose.  Will increase Zoloft to 50mg  daily. He does not want to switch medications at this time even though he is experiencing nausea. Will follow up in 2 months for reevaluation.

## 2020-09-29 NOTE — Assessment & Plan Note (Addendum)
Chronic.  Controlled.  Continue with current medication regimen. Refill sent today. Return to clinic in 2 months for reevaluation.  Call sooner if concerns arise.

## 2020-09-29 NOTE — Assessment & Plan Note (Signed)
Chronic. Improved from prior. Feels like depression is much better but feels like his anxiety could be better.  Does feel like he will benefit from increasing the Zoloft dose.  Will increase Zoloft to 50mg  daily. He does not want to switch medications at this time even though he is experiencing nausea. Will follow up in 2 months for reevaluation.

## 2020-11-29 NOTE — Progress Notes (Signed)
BP 133/80   Pulse 68   Ht _0  (1.727 m)   Wt 208 lb (94.3 kg)   BMI 31.63 kg/m    Subjective:    Patient ID: Devin Carpenter, male    DOB: 26-Dec-1990, 30 y.o.   MRN: [id number]  HPI: Devin Carpenter is a 30 y.o. male  Chief Complaint  Patient presents with   Hypertension   Depression   HYPERTENSION Hypertension status: controlled  Satisfied with current treatment? no Duration of hypertension: years BP monitoring frequency:  daily BP range: 132/82 BP medication side effects:  no Medication compliance: excellent compliance Previous BP meds:lisinopril Aspirin: no Recurrent headaches: no Visual changes: no Palpitations: no Dyspnea: no Chest pain: no Lower extremity edema: no Dizzy/lightheaded: no  DEPRESSION/ANXIETY Patient states he is not really feeling any of the depression.  Feels like he is in a good spot.  The Zoloft is working well for him.  Denies SI.  Denies concerns regarding depression/anxiety at visit today.    Relevant past medical, surgical, family and social history reviewed and updated as indicated. Interim medical history since our last visit reviewed. Allergies and medications reviewed and updated.  Review of Systems  Eyes:  Negative for visual disturbance.  Respiratory:  Negative for shortness of breath.   Cardiovascular:  Negative for chest pain and leg swelling.  Neurological:  Negative for light-headedness and headaches.  Psychiatric/Behavioral:  Negative for dysphoric mood and suicidal ideas. The patient is not nervous/anxious.    Per HPI unless specifically indicated above     Objective:    BP 133/80   Pulse 68   Ht _1  (1.727 m)   Wt 208 lb (94.3 kg)   BMI 31.63 kg/m   Wt Readings from Last 3 Encounters:  11/30/20 208 lb (94.3 kg)  09/29/20 215 lb (97.5 kg)  08/30/20 211 lb 12.8 oz (96.1 kg)    Physical Exam Vitals and nursing note reviewed.  Constitutional:      General: He is not in acute distress.    Appearance: Normal  appearance. He is not ill-appearing, toxic-appearing or diaphoretic.  HENT:     Head: Normocephalic.     Right Ear: External ear normal.     Left Ear: External ear normal.     Nose: Nose normal. No congestion or rhinorrhea.     Mouth/Throat:     Mouth: Mucous membranes are moist.  Eyes:     General:        Right eye: No discharge.        Left eye: No discharge.     Extraocular Movements: Extraocular movements intact.     Conjunctiva/sclera: Conjunctivae normal.     Pupils: Pupils are equal, round, and reactive to light.  Cardiovascular:     Rate and Rhythm: Normal rate and regular rhythm.     Heart sounds: No murmur heard. Pulmonary:     Effort: Pulmonary effort is normal. No respiratory distress.     Breath sounds: Normal breath sounds. No wheezing, rhonchi or rales.  Abdominal:     General: Abdomen is flat. Bowel sounds are normal.  Musculoskeletal:     Cervical back: Normal range of motion and neck supple.  Skin:    General: Skin is warm and dry.     Capillary Refill: Capillary refill takes less than 2 seconds.  Neurological:     General: No focal deficit present.     Mental Status: He is alert and oriented to person, place, and time.  Psychiatric:        Mood and Affect: Mood normal.        Behavior: Behavior normal.        Thought Content: Thought content normal.        Judgment: Judgment normal.    Results for orders placed or performed in visit on 09/09/19  POCT Urine Drug Screen (CPT 80101)  Result Value Ref Range   POC Methamphetamine UR None Detected None Detected   POC Opiate Ur None Detected None Detected   POC Amphetamine UR None Detected None Detected   POC Cocaine UR None Detected None Detected   POC PHENCYCLIDINE UR None Detected None Detected   URINE TEMPERATURE 92.0 90.0 - 100.0 Degrees F      Assessment & Plan:   Problem List Items Addressed This Visit       Cardiovascular and Mediastinum   Primary hypertension    Chronic.  Controlled.   Continue with current medication regimen of Lisinopril 25m daily.  Labs ordered today.  Refills sent.  Continue to monitor blood pressures at home.  If greater than 140/90 follow up in clinic.  Return to clinic in 6 months for reevaluation.  Call sooner if concerns arise.        Relevant Medications   lisinopril (ZESTRIL) 10 MG tablet   Other Relevant Orders   Comp Met (CMET)     Other   Depression, recurrent ([place]    Chronic.  Controlled.  Continue with current medication regimen of Zoloft 544mdaily.  Labs ordered today.  Refills sent today. Return to clinic in 6 months for reevaluation.  Call sooner if concerns arise.        Relevant Medications   sertraline (ZOLOFT) 50 MG tablet   Anxiety    Chronic.  Controlled.  Continue with current medication regimen of Zoloft 5021maily.  Labs ordered today.  Refills sent today. Return to clinic in 6 months for reevaluation.  Call sooner if concerns arise.       Relevant Medications   sertraline (ZOLOFT) 50 MG tablet     Follow up plan: Return in about 6 months (around 05/30/2021) for BP Check, Depression/Anxiety FU.

## 2020-11-30 ENCOUNTER — Ambulatory Visit: Payer: BC Managed Care – PPO | Admitting: Nurse Practitioner

## 2020-11-30 ENCOUNTER — Other Ambulatory Visit: Payer: Self-pay

## 2020-11-30 ENCOUNTER — Encounter: Payer: Self-pay | Admitting: Nurse Practitioner

## 2020-11-30 VITALS — BP 133/80 | HR 68 | Ht 68.0 in | Wt 208.0 lb

## 2020-11-30 DIAGNOSIS — F419 Anxiety disorder, unspecified: Secondary | ICD-10-CM

## 2020-11-30 DIAGNOSIS — F339 Major depressive disorder, recurrent, unspecified: Secondary | ICD-10-CM | POA: Diagnosis not present

## 2020-11-30 DIAGNOSIS — I10 Essential (primary) hypertension: Secondary | ICD-10-CM

## 2020-11-30 MED ORDER — LISINOPRIL 10 MG PO TABS: 10.0000 mg | ORAL_TABLET | Freq: Every day | ORAL | 1 refills | Status: AC

## 2020-11-30 MED ORDER — SERTRALINE HCL 50 MG PO TABS: 50.0000 mg | ORAL_TABLET | Freq: Every day | ORAL | 1 refills | Status: AC

## 2020-11-30 NOTE — Assessment & Plan Note (Signed)
Chronic.  Controlled.  Continue with current medication regimen of Zoloft 50mg daily.  Labs ordered today.  Refills sent today. Return to clinic in 6 months for reevaluation.  Call sooner if concerns arise.  

## 2020-11-30 NOTE — Assessment & Plan Note (Addendum)
Chronic.  Controlled.  Continue with current medication regimen of Lisinopril 10mg  daily.  Labs ordered today.  Refills sent.  Continue to monitor blood pressures at home.  If greater than 140/90 follow up in clinic.  Return to clinic in 6 months for reevaluation.  Call sooner if concerns arise.

## 2020-11-30 NOTE — Assessment & Plan Note (Signed)
Chronic.  Controlled.  Continue with current medication regimen of Zoloft 50mg  daily.  Labs ordered today.  Refills sent today. Return to clinic in 6 months for reevaluation.  Call sooner if concerns arise.

## 2020-12-01 LAB — COMPREHENSIVE METABOLIC PANEL
ALT: 60 IU/L — ABNORMAL HIGH (ref 0–44)
AST: 57 IU/L — ABNORMAL HIGH (ref 0–40)
Albumin/Globulin Ratio: 1.7 (ref 1.2–2.2)
Albumin: 4.8 g/dL (ref 4.1–5.2)
Alkaline Phosphatase: 119 IU/L (ref 44–121)
BUN/Creatinine Ratio: 20 (ref 9–20)
BUN: 22 mg/dL — ABNORMAL HIGH (ref 6–20)
Bilirubin Total: 0.3 mg/dL (ref 0.0–1.2)
CO2: 19 mmol/L — ABNORMAL LOW (ref 20–29)
Calcium: 9.7 mg/dL (ref 8.7–10.2)
Chloride: 104 mmol/L (ref 96–106)
Creatinine, Ser: 1.1 mg/dL (ref 0.76–1.27)
Globulin, Total: 2.8 g/dL (ref 1.5–4.5)
Glucose: 86 mg/dL (ref 70–99)
Potassium: 4.8 mmol/L (ref 3.5–5.2)
Sodium: 139 mmol/L (ref 134–144)
Total Protein: 7.6 g/dL (ref 6.0–8.5)
eGFR: 94 mL/min/{1.73_m2} (ref >59)

## 2020-12-01 NOTE — Progress Notes (Signed)
Hi Devin Carpenter.  Lab work looks good.  Liver enzymes are slightly elevated.  Recommend we continue to monitor them in the future.  If they continue to increase we will do an US of the liver but I do not think it is necessary at this time.  Please let me know if you have any questions.

## 2021-03-07 ENCOUNTER — Encounter: Payer: Self-pay | Admitting: Emergency Medicine

## 2021-03-07 ENCOUNTER — Emergency Department
Admission: EM | Admit: 2021-03-07 | Discharge: 2021-03-07 | Disposition: A | Discharge status: Home / Self Care | Payer: BC Managed Care – PPO | Attending: Emergency Medicine | Admitting: Emergency Medicine

## 2021-03-07 ENCOUNTER — Emergency Department: Payer: BC Managed Care – PPO

## 2021-03-07 ENCOUNTER — Other Ambulatory Visit: Payer: Self-pay

## 2021-03-07 DIAGNOSIS — R0789 Other chest pain: Secondary | ICD-10-CM | POA: Diagnosis present

## 2021-03-07 DIAGNOSIS — I10 Essential (primary) hypertension: Secondary | ICD-10-CM | POA: Diagnosis not present

## 2021-03-07 DIAGNOSIS — R079 Chest pain, unspecified: Secondary | ICD-10-CM

## 2021-03-07 HISTORY — DX: Essential (primary) hypertension: I10

## 2021-03-07 LAB — CBC WITH DIFFERENTIAL/PLATELET
Abs Immature Granulocytes: 0.03 10*3/uL (ref 0.00–0.07)
Basophils Absolute: 0.1 10*3/uL (ref 0.0–0.1)
Basophils Relative: 1 %
Eosinophils Absolute: 0.2 10*3/uL (ref 0.0–0.5)
Eosinophils Relative: 2 %
HCT: 46.2 % (ref 39.0–52.0)
Hemoglobin: 15.9 g/dL (ref 13.0–17.0)
Immature Granulocytes: 0 %
Lymphocytes Relative: 47 %
Lymphs Abs: 4.5 10*3/uL — ABNORMAL HIGH (ref 0.7–4.0)
MCH: 29.6 pg (ref 26.0–34.0)
MCHC: 34.4 g/dL (ref 30.0–36.0)
MCV: 85.9 fL (ref 80.0–100.0)
Monocytes Absolute: 0.7 10*3/uL (ref 0.1–1.0)
Monocytes Relative: 7 %
Neutro Abs: 4.2 10*3/uL (ref 1.7–7.7)
Neutrophils Relative %: 43 %
Platelets: 338 10*3/uL (ref 150–400)
RBC: 5.38 MIL/uL (ref 4.22–5.81)
RDW: 12.3 % (ref 11.5–15.5)
WBC: 9.7 10*3/uL (ref 4.0–10.5)
nRBC: 0 % (ref 0.0–0.2)

## 2021-03-07 LAB — COMPREHENSIVE METABOLIC PANEL
ALT: 43 U/L (ref 0–44)
AST: 29 U/L (ref 15–41)
Albumin: 4.3 g/dL (ref 3.5–5.0)
Alkaline Phosphatase: 87 U/L (ref 38–126)
Anion gap: 9 (ref 5–15)
BUN: 16 mg/dL (ref 6–20)
CO2: 18 mmol/L — ABNORMAL LOW (ref 22–32)
Calcium: 9.3 mg/dL (ref 8.9–10.3)
Chloride: 109 mmol/L (ref 98–111)
Creatinine, Ser: 0.69 mg/dL (ref 0.61–1.24)
GFR, Estimated: >60 mL/min (ref >60)
Glucose, Bld: 104 mg/dL — ABNORMAL HIGH (ref 70–99)
Potassium: 4 mmol/L (ref 3.5–5.1)
Sodium: 136 mmol/L (ref 135–145)
Total Bilirubin: 1 mg/dL (ref 0.3–1.2)
Total Protein: 7.7 g/dL (ref 6.5–8.1)

## 2021-03-07 LAB — TROPONIN I (HIGH SENSITIVITY): Troponin I (High Sensitivity): 5 ng/L (ref <18)

## 2021-03-07 MED ORDER — LIDOCAINE 5 % EX PTCH: 1.0000 | MEDICATED_PATCH | Freq: Two times a day (BID) | CUTANEOUS | 0 refills | Status: AC

## 2021-03-07 NOTE — ED Triage Notes (Signed)
Patient ambulatory to triage with steady gait, without difficulty or distress noted; pt reports awoke with left sided CP radiating into left arm; denies hx of same; denies accomp symptoms

## 2021-03-07 NOTE — ED Notes (Signed)
Pt c/o chest pain with SOB that started around 4am this morning. Pt states it was a sharp pain on left side of chest that radiated to left shoulder.   Pt states that chest pain on left side of chest is now a dull pain that subsided at this time. Pt is not complaining of any SOB.

## 2021-03-07 NOTE — ED Provider Notes (Signed)
Denies any  [facility] Provider Note    Event Date/Time   First MD Initiated Contact with Patient 03/07/21 0915     (approximate)   History   Chest Pain   HPI  Devin Carpenter is a 31 y.o. male with past medical history of hypertension and anxiety who presents to the ED complaining of chest pain.  Patient reports that he woke up this morning with sharp discomfort in the left side of his chest that seem to worsen while he was driving to work.  He states the pain has eased up since then and now feels like more of a pulling when he twists a certain way or moves his left arm.  He states he felt short of breath when the pain first came on, however his breathing now feels back to normal.  He denies any recent fevers, cough, pain or swelling in his legs.  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     Physical Exam   Triage Vital Signs: ED Triage Vitals  Enc Vitals Group     BP 03/07/21 0415 (!) 125/93     Pulse Rate 03/07/21 0415 64     Resp 03/07/21 0415 18     Temp 03/07/21 0415 99.3 F (37.4 C)     Temp Source 03/07/21 0415 Oral     SpO2 03/07/21 0415 97 %     Weight 03/07/21 0417 215 lb (97.5 kg)     Height 03/07/21 0417 5\' 9"  (1.753 m)     Head Circumference --      Peak Flow --      Pain Score 03/07/21 0416 6     Pain Loc --      Pain Edu? --      Excl. in GC? --     Most recent vital signs: Vitals:   03/07/21 0910 03/07/21 1000  BP: 130/88 131/85  Pulse: 64 63  Resp: 16 16  Temp:    SpO2: 100% 100%     General: Awake, no distress.  CV:  Good peripheral perfusion.  Regular rate and rhythm, no murmurs, rubs, or gallops.  2+ radial pulses bilaterally. Resp:  Normal effort.  Clear to auscultation bilaterally.  Left chest wall is tender to palpation, reproducing reported pain. Abd:  No distention.  Soft and nontender.   ED Results / Procedures /  Treatments   Labs (all labs ordered are listed, but only abnormal results are displayed) Labs Reviewed  CBC WITH DIFFERENTIAL/PLATELET - Abnormal; Notable for the following components:      Result Value   Lymphs Abs 4.5 (*)    All other components within normal limits  COMPREHENSIVE METABOLIC PANEL - Abnormal; Notable for the following components:   CO2 18 (*)    Glucose, Bld 104 (*)    All other components within normal limits  TROPONIN I (HIGH SENSITIVITY)     EKG  ED ECG REPORT I, 05/05/21, the attending physician, personally viewed and interpreted this ECG.   Date: 03/07/2021  EKG Time: 4:19  Rate: 66  Rhythm: normal sinus rhythm  Axis: Normal  Intervals:none  ST&T Change: None    RADIOLOGY Chest x-ray reviewed by me with no infiltrate, edema, or effusion.  Negative for acute process per radiology.    PROCEDURES:  Critical Care performed: No  .1-3 Lead EKG Interpretation Performed by: 05/05/2021,  Leonette Most, MD Authorized by: Chesley Noon, MD     Interpretation: normal     ECG rate:  65-70   ECG rate assessment: normal     Rhythm: sinus rhythm     Ectopy: none     Conduction: normal     MEDICATIONS ORDERED IN ED: Medications - No data to display   IMPRESSION / MDM / ASSESSMENT AND PLAN / ED COURSE  I reviewed the triage vital signs and the nursing notes.                              31 year old male with past medical history of hypertension and anxiety who presents to the ED with pain in the left side of his chest that started as sharp and now feels like a pulling that is worse with certain positions.  Vital signs are reassuring and EKG shows no evidence of arrhythmia or ischemia.  Chest x-ray reviewed by me and shows no infiltrate, edema, or effusion.  Differential diagnosis includes, but is not limited to, ACS, PE, dissection, pneumonia, pneumothorax, musculoskeletal, GERD.  The patient is on the cardiac monitor to evaluate for evidence of  arrhythmia and/or significant heart rate changes.  Work-up from triage is unremarkable, EKG shows no evidence of arrhythmia or ischemia and 2 sets of troponin are negative.  I doubt ACS given minimal risk factors and heart score of less than 4.  CBC and BMP show no anemia or electrolyte abnormality.  No evidence of pneumonia and given improving symptoms I doubt PE or dissection.  Symptoms seem most likely to be musculoskeletal in etiology given they are reproduced with palpation.  Patient has been seen by PCP previously for depression and anxiety, has not dealt with chest pain in the past.  He is appropriate for outpatient follow-up with his PCP, was counseled to use Lidoderm patches as well as Tylenol and ibuprofen for symptoms.  He was counseled to return to the ED for new or worsening symptoms, patient agrees with plan.       FINAL CLINICAL IMPRESSION(S) / ED DIAGNOSES   Final diagnoses:  Nonspecific chest pain     Rx / DC Orders   ED Discharge Orders          Ordered    lidocaine (LIDODERM) 5 %  Every 12 hours        03/07/21 1037             Note:  This document was prepared using Dragon voice recognition software and may include unintentional dictation errors.    Chesley Noon, MD 03/07/21 1038

## 2021-05-29 NOTE — Progress Notes (Deleted)
? ?There were no vitals taken for this visit.  ? ?Subjective:  ? ? Patient ID: Devin Carpenter, male    DOB: 1990-08-31, 31 y.o.   MRN: [id number] ? ?HPI: ?Devin Carpenter is a 31 y.o. male ? ?No chief complaint on file. ? ?HYPERTENSION ?Hypertension status: {Blank single:19197::"controlled","uncontrolled","better","worse","exacerbated","stable"}  ?Satisfied with current treatment? {Blank single:19197::"yes","no"} ?Duration of hypertension: {Blank single:19197::"chronic","months","years"} ?BP monitoring frequency:  {Blank single:19197::"not checking","rarely","daily","weekly","monthly","a few times a day","a few times a week","a few times a month"} ?BP range:  ?BP medication side effects:  {Blank single:19197::"yes","no"} ?Medication compliance: {Blank single:19197::"excellent compliance","good compliance","fair compliance","poor compliance"} ?Previous BP meds:{Blank multiple:19196::"none","amlodipine","amlodipine/benazepril","atenolol","benazepril","benazepril/HCTZ","bisoprolol (bystolic)","carvedilol","chlorthalidone","clonidine","diltiazem","exforge HCT","HCTZ","irbesartan (avapro)","labetalol","lisinopril","lisinopril-HCTZ","losartan (cozaar)","methyldopa","nifedipine","olmesartan (benicar)","olmesartan-HCTZ","quinapril","ramipril","spironalactone","tekturna","valsartan","valsartan-HCTZ","verapamil"} ?Aspirin: {Blank single:19197::"yes","no"} ?Recurrent headaches: {Blank single:19197::"yes","no"} ?Visual changes: {Blank single:19197::"yes","no"} ?Palpitations: {Blank single:19197::"yes","no"} ?Dyspnea: {Blank single:19197::"yes","no"} ?Chest pain: {Blank single:19197::"yes","no"} ?Lower extremity edema: {Blank single:19197::"yes","no"} ?Dizzy/lightheaded: {Blank single:19197::"yes","no"} ? ?DEPRESSION/ANXIETY ? ? ?Relevant past medical, surgical, family and social history reviewed and updated as indicated. Interim medical history since our last visit reviewed. ?Allergies and medications reviewed and  updated. ? ?Review of Systems ? ?Per HPI unless specifically indicated above ? ?   ?Objective:  ?  ?There were no vitals taken for this visit.  ?Wt Readings from Last 3 Encounters:  ?03/07/21 215 lb (97.5 kg)  ?11/30/20 208 lb (94.3 kg)  ?09/29/20 215 lb (97.5 kg)  ?  ?Physical Exam ? ?Results for orders placed or performed during the hospital encounter of 03/07/21  ?CBC with Differential  ?Result Value Ref Range  ? WBC 9.7 4.0 - 10.5 K/uL  ? RBC 5.38 4.22 - 5.81 MIL/uL  ? Hemoglobin 15.9 13.0 - 17.0 g/dL  ? HCT 46.2 39.0 - 52.0 %  ? MCV 85.9 80.0 - 100.0 fL  ? MCH 29.6 26.0 - 34.0 pg  ? MCHC 34.4 30.0 - 36.0 g/dL  ? RDW 12.3 11.5 - 15.5 %  ? Platelets 338 150 - 400 K/uL  ? nRBC 0.0 0.0 - 0.2 %  ? Neutrophils Relative % 43 %  ? Neutro Abs 4.2 1.7 - 7.7 K/uL  ? Lymphocytes Relative 47 %  ? Lymphs Abs 4.5 (H) 0.7 - 4.0 K/uL  ? Monocytes Relative 7 %  ? Monocytes Absolute 0.7 0.1 - 1.0 K/uL  ? Eosinophils Relative 2 %  ? Eosinophils Absolute 0.2 0.0 - 0.5 K/uL  ? Basophils Relative 1 %  ? Basophils Absolute 0.1 0.0 - 0.1 K/uL  ? Immature Granulocytes 0 %  ? Abs Immature Granulocytes 0.03 0.00 - 0.07 K/uL  ?Comprehensive metabolic panel  ?Result Value Ref Range  ? Sodium 136 135 - 145 mmol/L  ? Potassium 4.0 3.5 - 5.1 mmol/L  ? Chloride 109 98 - 111 mmol/L  ? CO2 18 (L) 22 - 32 mmol/L  ? Glucose, Bld 104 (H) 70 - 99 mg/dL  ? BUN 16 6 - 20 mg/dL  ? Creatinine, Ser 0.69 0.61 - 1.24 mg/dL  ? Calcium 9.3 8.9 - 10.3 mg/dL  ? Total Protein 7.7 6.5 - 8.1 g/dL  ? Albumin 4.3 3.5 - 5.0 g/dL  ? AST 29 15 - 41 U/L  ? ALT 43 0 - 44 U/L  ? Alkaline Phosphatase 87 38 - 126 U/L  ? Total Bilirubin 1.0 0.3 - 1.2 mg/dL  ? GFR, Estimated >60 >60 mL/min  ? Anion gap 9 5 - 15  ?Troponin I (High Sensitivity)  ?Result Value Ref Range  ? Troponin I (High Sensitivity) 5 <18 ng/L  ? ?   ?Assessment & Plan:  ? ?Problem List Items Addressed This  Visit   ? ?  ? Cardiovascular and Mediastinum  ? Primary hypertension - Primary  ?  ? Other  ?  Depression, recurrent (HCC)  ? Anxiety  ?  ? ?Follow up plan: ?No follow-ups on file. ? ? ? ? ? ?

## 2021-05-30 ENCOUNTER — Ambulatory Visit: Payer: BC Managed Care – PPO | Admitting: Nurse Practitioner

## 2021-05-30 DIAGNOSIS — I10 Essential (primary) hypertension: Secondary | ICD-10-CM

## 2021-05-30 DIAGNOSIS — E7800 Pure hypercholesterolemia, unspecified: Secondary | ICD-10-CM

## 2021-05-30 DIAGNOSIS — F419 Anxiety disorder, unspecified: Secondary | ICD-10-CM

## 2021-05-30 DIAGNOSIS — F339 Major depressive disorder, recurrent, unspecified: Secondary | ICD-10-CM

## 2021-05-30 DIAGNOSIS — R7301 Impaired fasting glucose: Secondary | ICD-10-CM

## 2021-08-24 ENCOUNTER — Other Ambulatory Visit: Payer: Self-pay

## 2021-08-24 ENCOUNTER — Emergency Department
Admission: EM | Admit: 2021-08-24 | Discharge: 2021-08-24 | Disposition: A | Discharge status: Home / Self Care | Payer: BC Managed Care – PPO | Attending: Emergency Medicine | Admitting: Emergency Medicine

## 2021-08-24 ENCOUNTER — Emergency Department: Payer: BC Managed Care – PPO

## 2021-08-24 ENCOUNTER — Encounter: Payer: Self-pay | Admitting: Emergency Medicine

## 2021-08-24 DIAGNOSIS — N451 Epididymitis: Secondary | ICD-10-CM | POA: Insufficient documentation

## 2021-08-24 DIAGNOSIS — I10 Essential (primary) hypertension: Secondary | ICD-10-CM | POA: Insufficient documentation

## 2021-08-24 DIAGNOSIS — N50811 Right testicular pain: Secondary | ICD-10-CM

## 2021-08-24 LAB — URINALYSIS, ROUTINE W REFLEX MICROSCOPIC
Bilirubin Urine: NEGATIVE
Glucose, UA: NEGATIVE mg/dL
Hgb urine dipstick: NEGATIVE
Ketones, ur: NEGATIVE mg/dL
Leukocytes,Ua: NEGATIVE
Nitrite: NEGATIVE
Protein, ur: NEGATIVE mg/dL
Specific Gravity, Urine: 1.005 (ref 1.005–1.030)
pH: 7 (ref 5.0–8.0)

## 2021-08-24 LAB — CHLAMYDIA/NGC RT PCR (ARMC ONLY)
Chlamydia Tr: NOT DETECTED
N gonorrhoeae: NOT DETECTED

## 2021-08-24 MED ORDER — OXYCODONE-ACETAMINOPHEN 5-325 MG PO TABS
1.0000 | ORAL_TABLET | Freq: Once | ORAL | Status: AC
  Administered 2021-08-24: 1 via ORAL
  Filled 2021-08-24: qty 1

## 2021-08-24 MED ORDER — OXYCODONE-ACETAMINOPHEN 5-325 MG PO TABS: 1.0000 | ORAL_TABLET | ORAL | 0 refills | Status: AC | PRN

## 2021-08-24 MED ORDER — SULFAMETHOXAZOLE-TRIMETHOPRIM 800-160 MG PO TABS: 1.0000 | ORAL_TABLET | Freq: Two times a day (BID) | ORAL | 0 refills | Status: AC

## 2021-08-24 MED ORDER — SULFAMETHOXAZOLE-TRIMETHOPRIM 800-160 MG PO TABS
1.0000 | ORAL_TABLET | Freq: Once | ORAL | Status: AC
  Administered 2021-08-24: 1 via ORAL
  Filled 2021-08-24: qty 1

## 2021-08-24 NOTE — ED Provider Notes (Signed)
[facility] Provider Note    Event Date/Time   First MD Initiated Contact with Patient 08/24/21 [phone]     (approximate)   History   Testicle Pain   HPI  Devin Carpenter is a 31 y.o. male with no significant past medical history presents for evaluation of testicular pain.  Patient reports the pain woke him up from his sleep as a severe sharp pain located in the right testicle radiating down his thigh and up into his abdomen.  He reports that he feels like the right testicle looks slightly swollen when compared to normal but denies any redness, and penile discharge.  Patient is in a monogamous relationship with his wife, no prior history of STDs.  Currently the pain is moderate in intensity.  No nausea or vomiting     Past Medical History:  Diagnosis Date   Back pain    Hypertension    Migraine     Past Surgical History:  Procedure Laterality Date   VASECTOMY       Physical Exam   Triage Vital Signs: ED Triage Vitals  Enc Vitals Group     BP 08/24/21 0535 (!) 142/84     Pulse Rate 08/24/21 0535 74     Resp 08/24/21 0535 18     Temp 08/24/21 0535 97.6 F (36.4 C)     Temp Source 08/24/21 0535 Oral     SpO2 08/24/21 0535 100 %     Weight 08/24/21 0531 217 lb (98.4 kg)     Height 08/24/21 0531 5\' 9"  (1.753 m)     Head Circumference --      Peak Flow --      Pain Score 08/24/21 0531 8     Pain Loc --      Pain Edu? --      Excl. in GC? --     Most recent vital signs: Vitals:   08/24/21 0535  BP: (!) 142/84  Pulse: 74  Resp: 18  Temp: 97.6 F (36.4 C)  SpO2: 100%     Constitutional: Alert and oriented. Well appearing and in no apparent distress. HEENT:      Head: Normocephalic and atraumatic.         Eyes: Conjunctivae are normal. Sclera is non-icteric.       Mouth/Throat: Mucous membranes are moist.       Neck: Supple with no signs of meningismus. Cardiovascular: Regular rate and rhythm. No murmurs, gallops, or rubs. 2+  symmetrical distal pulses are present in all extremities.  Respiratory: Normal respiratory effort. Lungs are clear to auscultation bilaterally.  Gastrointestinal: Soft, non tender, and non distended with positive bowel sounds. No rebound or guarding. Genitourinary: Bilateral testicles are descended mild diffuse tenderness to palpation of the right testicle, bilateral positive cremasteric reflexes are present, no swelling or erythema of the scrotum. No evidence of inguinal hernia. Musculoskeletal:  No edema, cyanosis, or erythema of extremities. Neurologic: Normal speech and language. Face is symmetric. Moving all extremities. No gross focal neurologic deficits are appreciated. Skin: Skin is warm, dry and intact. No rash noted. Psychiatric: Mood and affect are normal. Speech and behavior are normal.  ED Results / Procedures / Treatments   Labs (all labs ordered are listed, but only abnormal results are displayed) Labs Reviewed  URINALYSIS, ROUTINE W REFLEX MICROSCOPIC - Abnormal; Notable for the following components:      Result Value   Color, Urine STRAW (*)    APPearance CLEAR (*)  All other components within normal limits  CHLAMYDIA/NGC RT PCR ([facility] ONLY)               EKG  none   RADIOLOGY I, Nita Sickle, attending MD, have personally viewed and interpreted the images obtained during this visit as below:  US showing R epididymitis   ___________________________________________________ Interpretation by Radiologist:  US SCROTUM W/DOPPLER  Result Date: 08/24/2021 CLINICAL DATA:  Right testicular pain since yesterday EXAM: SCROTAL ULTRASOUND DOPPLER ULTRASOUND OF THE TESTICLES TECHNIQUE: Complete ultrasound examination of the testicles, epididymis, and other scrotal structures was performed. Color and spectral Doppler ultrasound were also utilized to evaluate blood flow to the testicles. COMPARISON:  None Available. FINDINGS: Right testicle Measurements: 42 x 24 x 27 mm.   No mass or abnormal vascularity. Left testicle Measurements:  40 x 24 x 26 mm.  No mass or abnormal vascularity. Right epididymis:  Enlarged and hypervascular Left epididymis: Enlarged and hypervascular, although less so than on the right Hydrocele:  No evidence of pyocele or significant hydrocele. Varicocele:  None visualized. Pulsed Doppler interrogation of both testes demonstrates normal low resistance arterial and venous waveforms bilaterally. IMPRESSION: Right more than left epididymitis. Electronically Signed   By: Tiburcio Pea M.D.   On: 08/24/2021 06:38      PROCEDURES:  Critical Care performed: No  Procedures    IMPRESSION / MDM / ASSESSMENT AND PLAN / ED COURSE  I reviewed the triage vital signs and the nursing notes.  30 y.o. male with no significant past medical history presents for evaluation of testicular pain.  On exam patient has diffuse mild tenderness to palpation of the right testicle, no swelling, erythema, no signs of torsion, positive cremasteric reflex, no penile discharge, no signs of inguinal hernia, no significant abdominal tenderness.  Ddx: Orchitis versus epididymitis versus torsion detorsion versus STD versus prostatitis   Plan: UA, GC chlamydia, testicular ultrasound.  We will give him Percocet for pain   MEDICATIONS GIVEN IN ED: Medications  sulfamethoxazole-trimethoprim (BACTRIM DS) 800-160 MG per tablet 1 tablet (has no administration in time range)  oxyCODONE-acetaminophen (PERCOCET/ROXICET) 5-325 MG per tablet 1 tablet (1 tablet Oral Given 08/24/21 0543)     ED COURSE: Ultrasound showing epididymitis with no signs of torsion.  UA is clean.  Patient be discharged home on Bactrim.  Low suspicion for STD.  Recommended follow-up with primary care doctor.  If gonorrhea and chlamydia are positive we will change antibiotics.  Discussed my standard return precautions   Consults: None   EMR reviewed none    FINAL CLINICAL IMPRESSION(S) / ED  DIAGNOSES   Final diagnoses:  Testicular pain, right  Epididymitis     Rx / DC Orders   ED Discharge Orders          Ordered    oxyCODONE-acetaminophen (PERCOCET) 5-325 MG tablet  Every 4 hours PRN        08/24/21 0716    sulfamethoxazole-trimethoprim (BACTRIM DS) 800-160 MG tablet  2 times daily        08/24/21 0716             Note:  This document was prepared using Dragon voice recognition software and may include unintentional dictation errors.   Please note:  Patient was evaluated in Emergency Department today for the symptoms described in the history of present illness. Patient was evaluated in the context of the global COVID-19 pandemic, which necessitated consideration that the patient might be at risk for infection with the SARS-CoV-2 virus  that causes COVID-19. Institutional protocols and algorithms that pertain to the evaluation of patients at risk for COVID-19 are in a state of rapid change based on information released by regulatory bodies including the CDC and federal and state organizations. These policies and algorithms were followed during the patient's care in the ED.  Some ED evaluations and interventions may be delayed as a result of limited staffing during the pandemic.       Don Perking, [place], MD 08/24/21 [phone]

## 2021-08-24 NOTE — ED Triage Notes (Signed)
Patient ambulatory to triage with steady gait, without difficulty or distress noted; pt reports since yesterday having rt testicular pain, slight swelling; denies any accomp symptoms

## 2021-08-24 NOTE — ED Notes (Signed)
Patient transported to Ultrasound 

## 2022-12-09 ENCOUNTER — Other Ambulatory Visit: Payer: Self-pay

## 2022-12-09 ENCOUNTER — Emergency Department
Admission: EM | Admit: 2022-12-09 | Discharge: 2022-12-10 | Disposition: A | Discharge status: Home / Self Care | Payer: 59 | Attending: Emergency Medicine | Admitting: Emergency Medicine

## 2022-12-09 ENCOUNTER — Emergency Department: Payer: 59

## 2022-12-09 DIAGNOSIS — N451 Epididymitis: Secondary | ICD-10-CM | POA: Insufficient documentation

## 2022-12-09 DIAGNOSIS — N50812 Left testicular pain: Secondary | ICD-10-CM | POA: Diagnosis present

## 2022-12-09 LAB — URINALYSIS, ROUTINE W REFLEX MICROSCOPIC
Bilirubin Urine: NEGATIVE
Glucose, UA: NEGATIVE mg/dL
Hgb urine dipstick: NEGATIVE
Ketones, ur: NEGATIVE mg/dL
Leukocytes,Ua: NEGATIVE
Nitrite: NEGATIVE
Protein, ur: NEGATIVE mg/dL
Specific Gravity, Urine: 1.034 — ABNORMAL HIGH (ref 1.005–1.030)
pH: 5 (ref 5.0–8.0)

## 2022-12-09 NOTE — ED Notes (Signed)
Pt ambulatory to restroom with independent steady gait to provide a urine sample.

## 2022-12-09 NOTE — ED Triage Notes (Addendum)
Pt to ed from home via POV for left sided testicular pain. Pt advised this has happened before and it was an infection in his testicle. Pt is caox4, in no acute distress and ambulatory in triage. Pt denies any trauma to his genitals. Pt advised symptoms started today and got progressively worse. Pt advised some minor swelling, denies heat when palpating and pain.

## 2022-12-10 LAB — CHLAMYDIA/NGC RT PCR (ARMC ONLY)
Chlamydia Tr: NOT DETECTED
N gonorrhoeae: NOT DETECTED

## 2022-12-10 MED ORDER — DOXYCYCLINE HYCLATE 100 MG PO TABS
100.0000 mg | ORAL_TABLET | Freq: Once | ORAL | Status: AC
  Administered 2022-12-10: 100 mg via ORAL
  Filled 2022-12-10: qty 1

## 2022-12-10 MED ORDER — CEFTRIAXONE SODIUM 1 G IJ SOLR
500.0000 mg | Freq: Once | INTRAMUSCULAR | Status: AC
  Administered 2022-12-10: 500 mg via INTRAMUSCULAR
  Filled 2022-12-10: qty 10

## 2022-12-10 MED ORDER — LIDOCAINE HCL (PF) 1 % IJ SOLN
1.0000 mL | Freq: Once | INTRAMUSCULAR | Status: AC
  Administered 2022-12-10: 2.1 mL
  Filled 2022-12-10: qty 5

## 2022-12-10 MED ORDER — DOXYCYCLINE HYCLATE 100 MG PO CAPS: 100.0000 mg | ORAL_CAPSULE | Freq: Two times a day (BID) | ORAL | 0 refills | Status: AC

## 2022-12-10 NOTE — Discharge Instructions (Signed)
Please read through the included information and complete the full 10-day course of antibiotics.  You can use over-the-counter ibuprofen and Tylenol according to label instructions as needed for pain.  Return to the emergency department if you develop new or worsening symptoms that concern you.

## 2022-12-10 NOTE — ED Provider Notes (Signed)
[facility] Provider Note    Event Date/Time   First MD Initiated Contact with Patient 12/09/22 2320     (approximate)   History   Testicle Pain (Left)   HPI Devin Carpenter is a 32 y.o. male who presents for evaluation of nearly 24 hours of gradually worsening pain in his left testicle.  It started as a mild ache but has gotten more severe and is worse with certain positions including standing.  No pain when he urinates.  No obvious swelling.  No wounds or discharge from his penis.  He is in monogamous relationship with his wife and has not engaged in any insertive anal intercourse and has not had sexual relations with other men.  The same thing happened to him about a year ago and he says he got better after a course of antibiotics.  He cannot remember the name of his infection (epididymitis versus orchitis).     Physical Exam   Triage Vital Signs: ED Triage Vitals  Encounter Vitals Group     BP 12/09/22 2026 (!) 150/85     Systolic BP Percentile --      Diastolic BP Percentile --      Pulse Rate 12/09/22 2026 70     Resp 12/09/22 2026 16     Temp 12/09/22 2026 98 F (36.7 C)     Temp Source 12/09/22 2026 Oral     SpO2 12/09/22 2026 98 %     Weight 12/09/22 2027 97.5 kg (215 lb)     Height 12/09/22 2027 1.778 m (5\' 10" )     Head Circumference --      Peak Flow --      Pain Score 12/09/22 2026 4     Pain Loc --      Pain Education --      Exclude from Growth Chart --     Most recent vital signs: Vitals:   12/09/22 2026  BP: (!) 150/85  Pulse: 70  Resp: 16  Temp: 98 F (36.7 C)  SpO2: 98%    General: Awake, no distress.  CV:  Good peripheral perfusion.  Resp:  Normal effort. Speaking easily and comfortably, no accessory muscle usage nor intercostal retractions.   Abd:  No distention.  GU:  Normal-appearing external circumcised male genitalia.  No penile discharge, no lesions.  Mild tenderness to palpation/manipulation of the left testis  and epididymis but no visible or palpable deformities.  No crepitus of the scrotum, no concern for Fournier's gangrene.   ED Results / Procedures / Treatments   Labs (all labs ordered are listed, but only abnormal results are displayed) Labs Reviewed  URINALYSIS, ROUTINE W REFLEX MICROSCOPIC - Abnormal; Notable for the following components:      Result Value   Color, Urine YELLOW (*)    APPearance CLEAR (*)    Specific Gravity, Urine 1.034 (*)    All other components within normal limits  CHLAMYDIA/NGC RT PCR [facility] ONLY)               RADIOLOGY I viewed and interpreted the patient's ultrasound and I see no obvious obstruction consistent with torsion.  The radiologist identified left epididymitis.   PROCEDURES:  Critical Care performed: No  Procedures    IMPRESSION / MDM / ASSESSMENT AND PLAN / ED COURSE  I reviewed the triage vital signs and the nursing notes.  Differential diagnosis includes, but is not limited to, epididymitis, orchitis, STI, torsion.  Patient's presentation is most consistent with acute presentation with potential threat to life or bodily function.  Labs/studies ordered: GC/chlamydia, urinalysis, scrotal ultrasound with Doppler.  Interventions/Medications given:  Medications  cefTRIAXone (ROCEPHIN) injection 500 mg (has no administration in time range)  lidocaine (PF) (XYLOCAINE) 1 % injection 1-2.1 mL (has no administration in time range)  doxycycline (VIBRA-TABS) tablet 100 mg (has no administration in time range)    (Note:  hospital course my include additional interventions and/or labs/studies not listed above.)   Scrotal ultrasound confirms epididymitis which is clinically consistent.  Vital signs are stable.  Urinalysis is negative.  GC/chlamydia is pending but I have very low suspicion.  I will treat empirically as per current recommendations with ceftriaxone 500 mg IM +10 days of twice daily doxycycline 100 mg  p.o.  Patient agrees with the plan.  I gave him follow-up information with primary care options and gave my usual return precautions.      FINAL CLINICAL IMPRESSION(S) / ED DIAGNOSES   Final diagnoses:  Left epididymitis     Rx / DC Orders   ED Discharge Orders     None        Note:  This document was prepared using Dragon voice recognition software and may include unintentional dictation errors.   Loleta Rose, MD 12/10/22 [phone]

## 2024-02-25 IMAGING — US US SCROTUM W/ DOPPLER COMPLETE
1 series · 14 of 25 positions shown · non-contrast
Comparison: None Available.

CLINICAL DATA: Right testicular pain since yesterday

EXAM:
SCROTAL ULTRASOUND
DOPPLER ULTRASOUND OF THE TESTICLES
TECHNIQUE: Complete ultrasound examination of the testicles, epididymis, and
other scrotal structures was performed. Color and spectral Doppler
ultrasound were also utilized to evaluate blood flow to the
testicles.

[Series 1: us scrotum w/doppler · 14 of 66 slices shown]
[im 1/66]
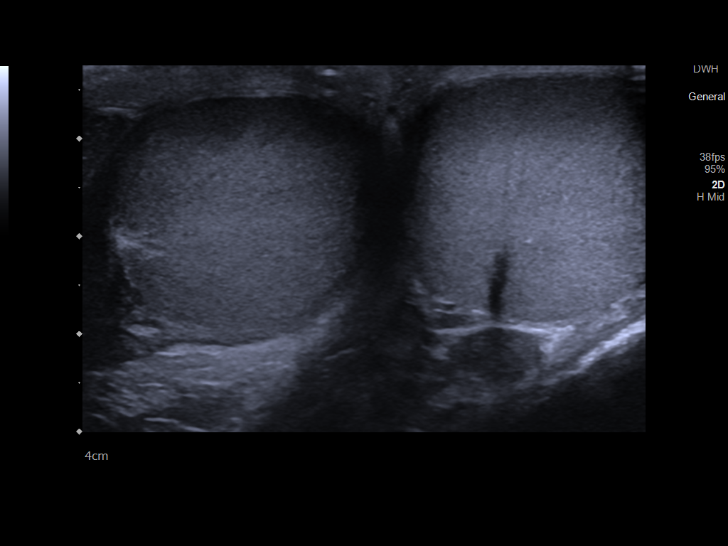
[im 6/66]
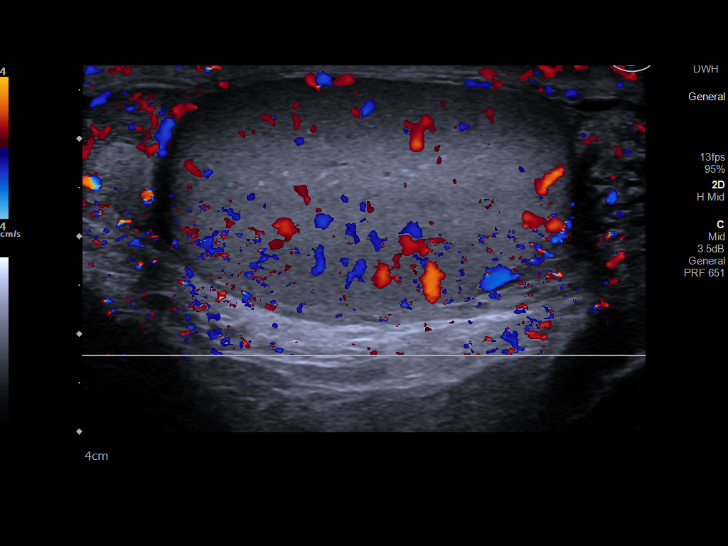
[im 11/66]
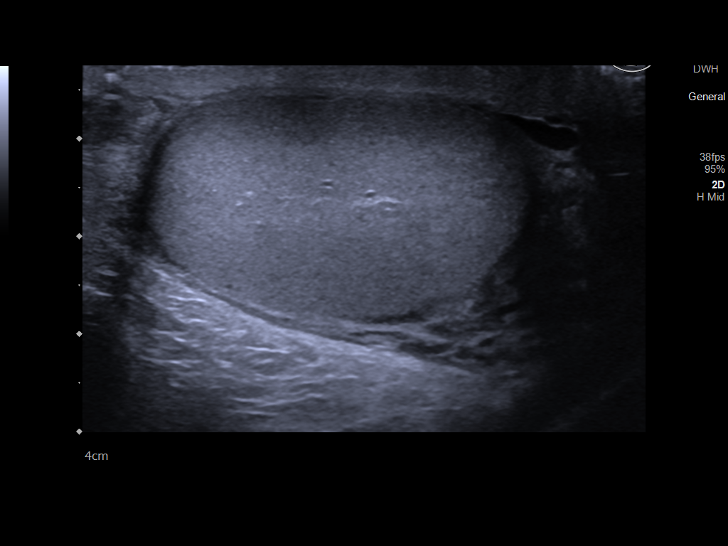
[im 17/66]
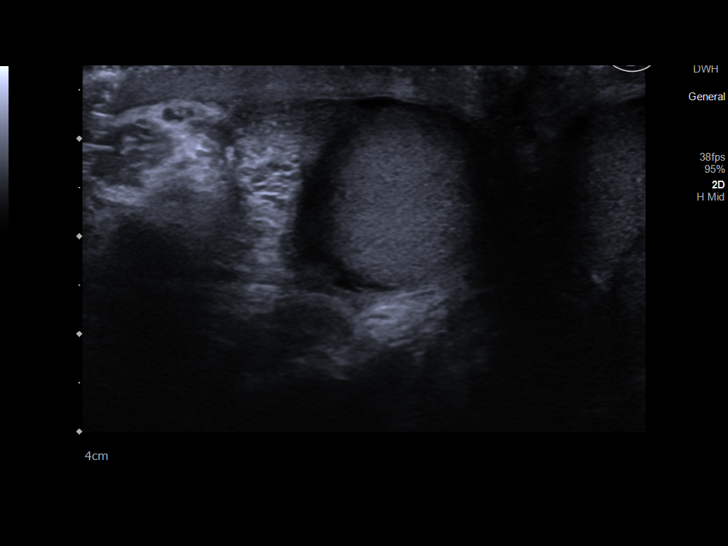
[im 22/66]
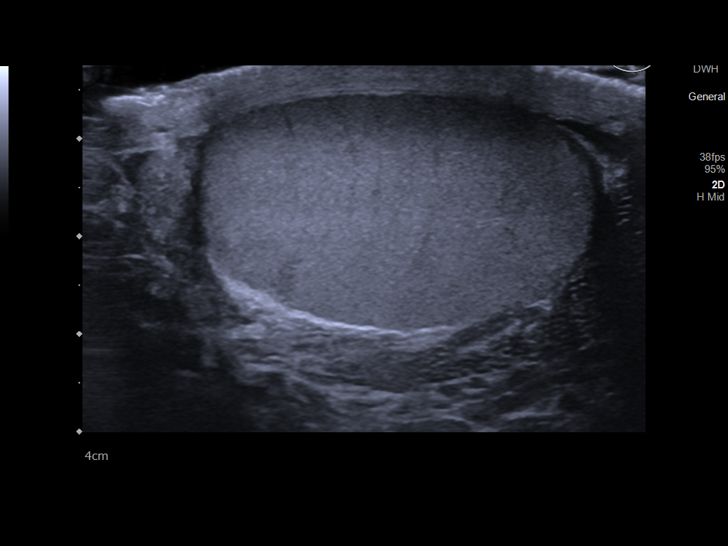
[im 25/66]
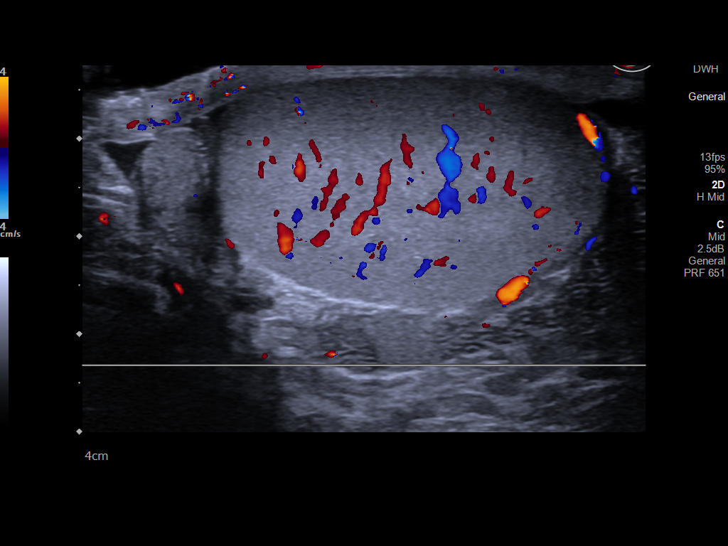
[im 30/66]
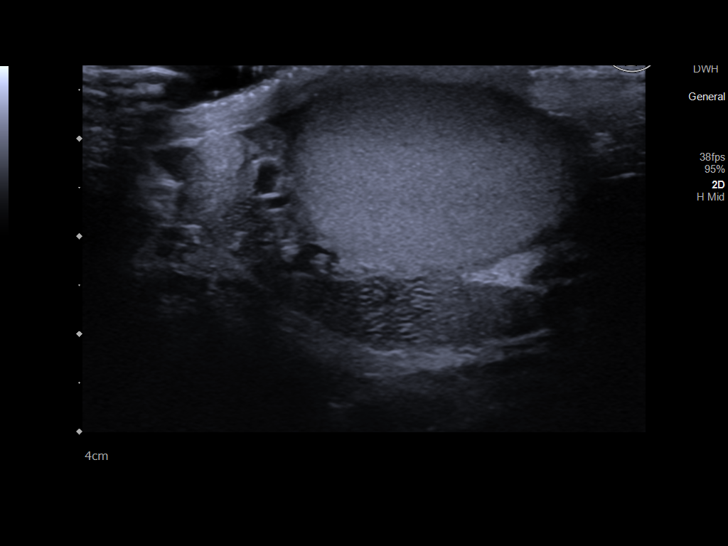
[im 36/66]
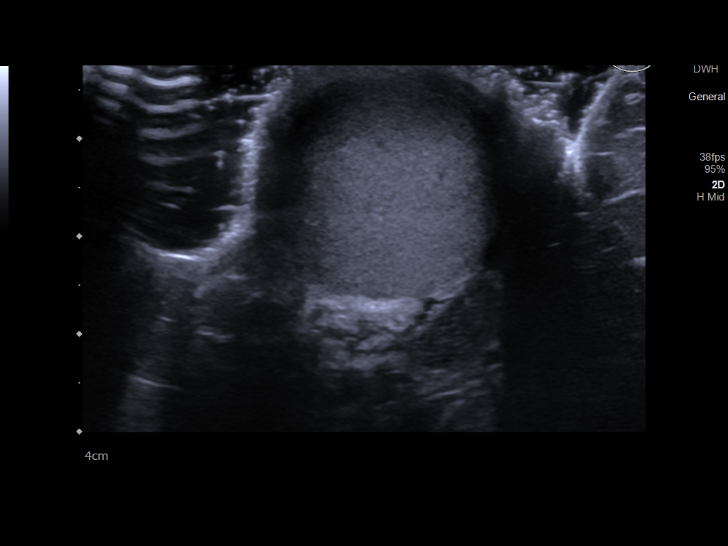
[im 41/66]
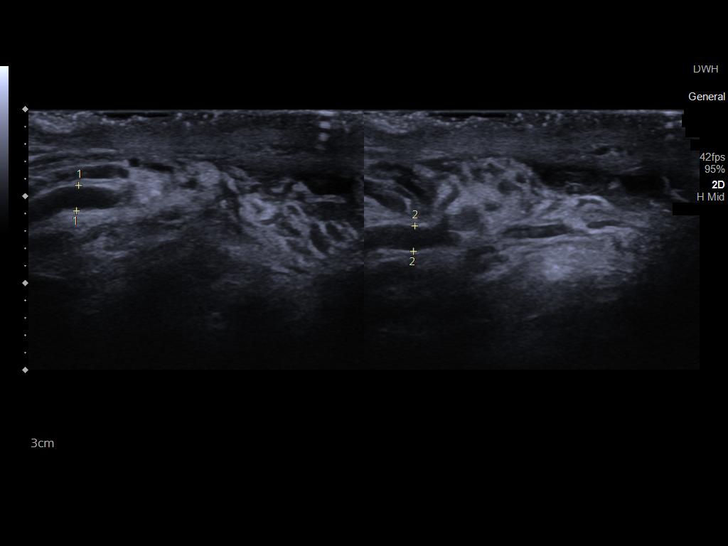
[im 44/66]
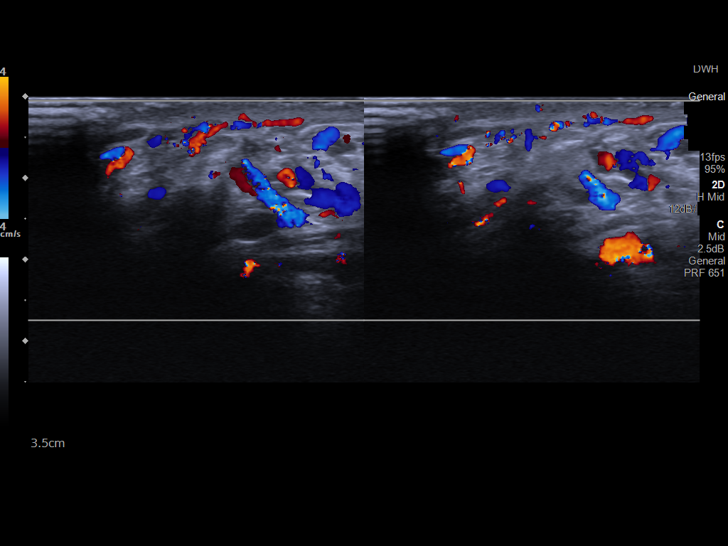
[im 49/66]
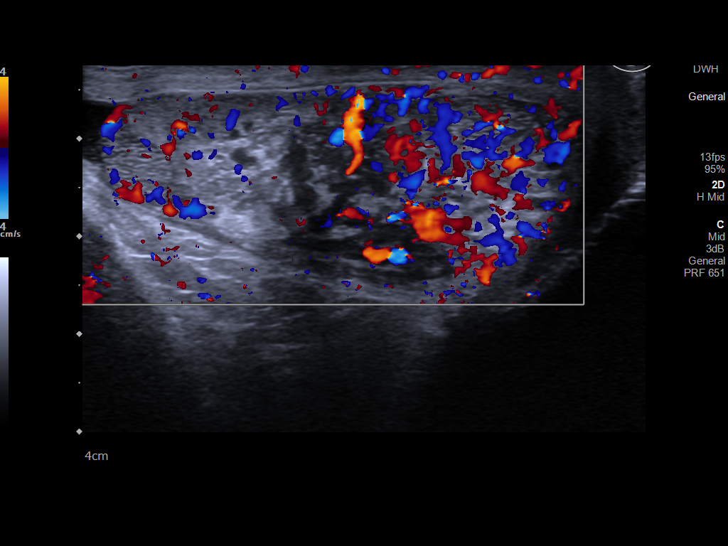
[im 55/66]
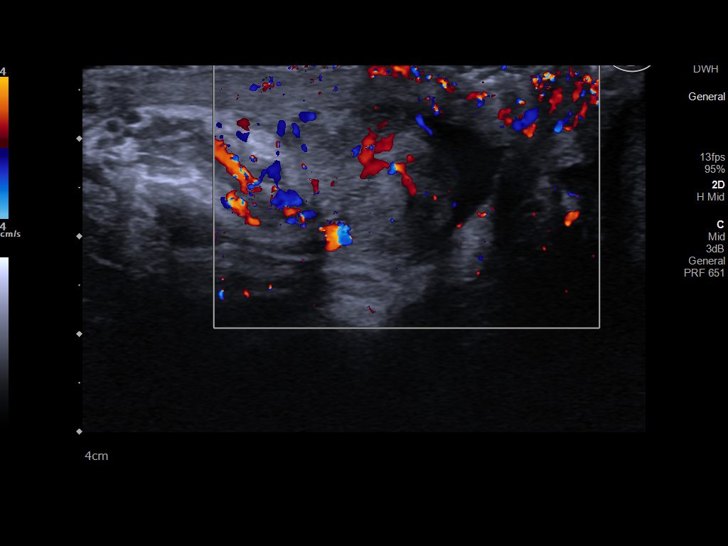
[im 60/66]
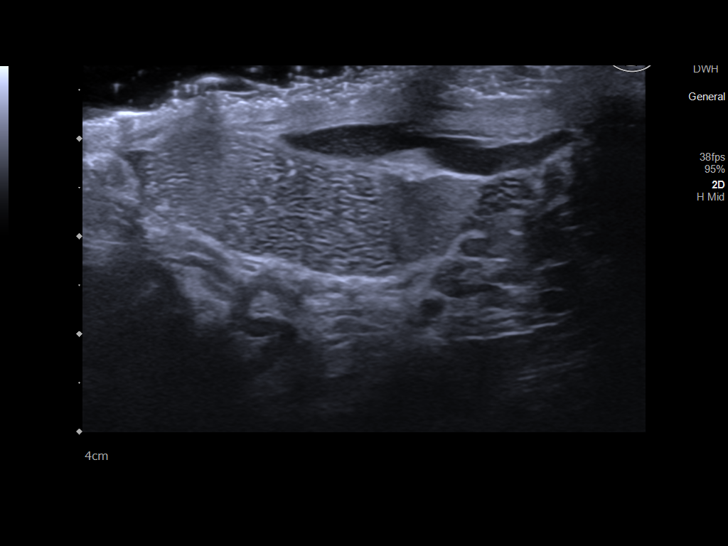
[im 66/66]
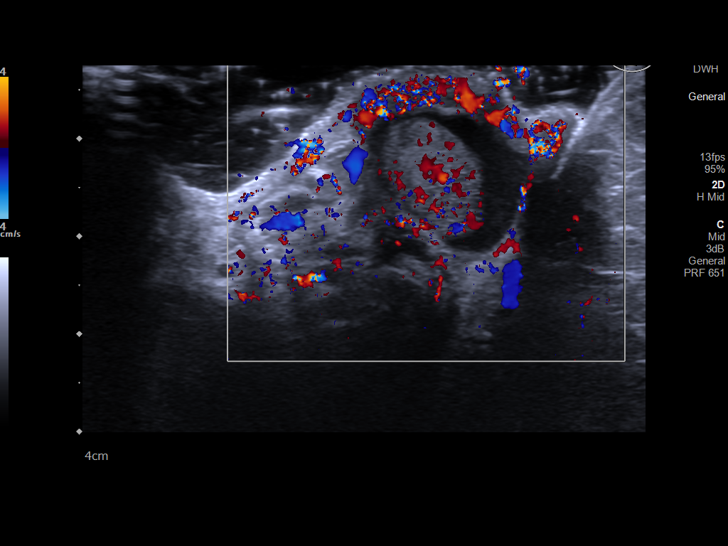

[14 of 25 positions shown; findings below may reference images not displayed]

FINDINGS: Right testicle

Measurements: 42 x 24 x 27 mm.  No mass or abnormal vascularity.

Left testicle

Measurements:  40 x 24 x 26 mm.  No mass or abnormal vascularity.

Right epididymis:  Enlarged and hypervascular

Left epididymis: Enlarged and hypervascular, although less so than
on the right

Hydrocele:  No evidence of pyocele or significant hydrocele.

Varicocele:  None visualized.

Pulsed Doppler interrogation of both testes demonstrates normal low
resistance arterial and venous waveforms bilaterally.
IMPRESSION: Right more than left epididymitis.
